# Patient Record
Sex: Female | Born: 1955 | Race: White | Hispanic: No | Marital: Married | Smoking: Never smoker
Health system: Southern US, Community
[De-identification: ages and names within clinical notes are randomized; demographics above are authoritative.]

## PROBLEM LIST (undated history)

## (undated) DIAGNOSIS — C4490 Unspecified malignant neoplasm of skin, unspecified: Secondary | ICD-10-CM

## (undated) DIAGNOSIS — R112 Nausea with vomiting, unspecified: Secondary | ICD-10-CM

## (undated) DIAGNOSIS — C50919 Malignant neoplasm of unspecified site of unspecified female breast: Secondary | ICD-10-CM

## (undated) DIAGNOSIS — H40039 Anatomical narrow angle, unspecified eye: Secondary | ICD-10-CM

## (undated) DIAGNOSIS — Z923 Personal history of irradiation: Secondary | ICD-10-CM

## (undated) DIAGNOSIS — Z9889 Other specified postprocedural states: Secondary | ICD-10-CM

## (undated) DIAGNOSIS — E7800 Pure hypercholesterolemia, unspecified: Secondary | ICD-10-CM

## (undated) DIAGNOSIS — R760 Raised antibody titer: Secondary | ICD-10-CM

## (undated) DIAGNOSIS — R51 Headache: Secondary | ICD-10-CM

## (undated) HISTORY — DX: Unspecified malignant neoplasm of skin, unspecified: C44.90

## (undated) HISTORY — PX: EYE SURGERY: SHX253

## (undated) HISTORY — DX: Anatomical narrow angle, unspecified eye: H40.039

## (undated) HISTORY — PX: DILATION AND CURETTAGE OF UTERUS: SHX78

## (undated) HISTORY — DX: Malignant neoplasm of unspecified site of unspecified female breast: C50.919

## (undated) HISTORY — DX: Raised antibody titer: R76.0

## (undated) HISTORY — DX: Headache: R51

## (undated) HISTORY — DX: Pure hypercholesterolemia, unspecified: E78.00

## (undated) HISTORY — PX: GANGLION CYST EXCISION: SHX1691

---

## 1997-01-20 HISTORY — PX: ABDOMINAL HYSTERECTOMY: SHX81

## 1997-11-16 ENCOUNTER — Encounter: Payer: Self-pay | Admitting: Obstetrics and Gynecology

## 1997-11-21 ENCOUNTER — Inpatient Hospital Stay (HOSPITAL_COMMUNITY): Admission: RE | Admit: 1997-11-21 | Discharge: 1997-11-24 | Payer: Self-pay | Admitting: Obstetrics and Gynecology

## 1999-03-21 ENCOUNTER — Encounter: Admission: RE | Admit: 1999-03-21 | Discharge: 1999-03-21 | Payer: Self-pay | Admitting: Obstetrics and Gynecology

## 1999-03-21 ENCOUNTER — Encounter: Payer: Self-pay | Admitting: Obstetrics and Gynecology

## 2000-05-04 ENCOUNTER — Encounter: Payer: Self-pay | Admitting: Obstetrics and Gynecology

## 2000-05-04 ENCOUNTER — Encounter: Admission: RE | Admit: 2000-05-04 | Discharge: 2000-05-04 | Payer: Self-pay | Admitting: Obstetrics and Gynecology

## 2001-04-02 ENCOUNTER — Other Ambulatory Visit: Admission: RE | Admit: 2001-04-02 | Discharge: 2001-04-02 | Payer: Self-pay | Admitting: Gynecology

## 2002-02-25 ENCOUNTER — Encounter: Payer: Self-pay | Admitting: Gynecology

## 2002-02-25 ENCOUNTER — Encounter: Admission: RE | Admit: 2002-02-25 | Discharge: 2002-02-25 | Payer: Self-pay | Admitting: Gynecology

## 2002-03-03 ENCOUNTER — Encounter: Payer: Self-pay | Admitting: Gynecology

## 2002-03-03 ENCOUNTER — Encounter: Admission: RE | Admit: 2002-03-03 | Discharge: 2002-03-03 | Payer: Self-pay | Admitting: Gynecology

## 2002-04-04 ENCOUNTER — Other Ambulatory Visit: Admission: RE | Admit: 2002-04-04 | Discharge: 2002-04-04 | Payer: Self-pay | Admitting: Gynecology

## 2002-04-19 ENCOUNTER — Encounter: Payer: Self-pay | Admitting: Family Medicine

## 2002-04-19 ENCOUNTER — Encounter: Admission: RE | Admit: 2002-04-19 | Discharge: 2002-04-19 | Payer: Self-pay | Admitting: Family Medicine

## 2003-04-24 ENCOUNTER — Other Ambulatory Visit: Admission: RE | Admit: 2003-04-24 | Discharge: 2003-04-24 | Payer: Self-pay | Admitting: Gynecology

## 2003-05-01 ENCOUNTER — Encounter: Admission: RE | Admit: 2003-05-01 | Discharge: 2003-05-01 | Payer: Self-pay | Admitting: Gynecology

## 2004-05-07 ENCOUNTER — Other Ambulatory Visit: Admission: RE | Admit: 2004-05-07 | Discharge: 2004-05-07 | Payer: Self-pay | Admitting: Gynecology

## 2004-06-06 ENCOUNTER — Encounter: Admission: RE | Admit: 2004-06-06 | Discharge: 2004-06-06 | Payer: Self-pay | Admitting: Gynecology

## 2005-05-12 ENCOUNTER — Other Ambulatory Visit: Admission: RE | Admit: 2005-05-12 | Discharge: 2005-05-12 | Payer: Self-pay | Admitting: Gynecology

## 2005-06-20 ENCOUNTER — Encounter: Admission: RE | Admit: 2005-06-20 | Discharge: 2005-06-20 | Payer: Self-pay | Admitting: Gynecology

## 2005-07-11 ENCOUNTER — Encounter: Admission: RE | Admit: 2005-07-11 | Discharge: 2005-07-11 | Payer: Self-pay | Admitting: Gynecology

## 2006-05-18 ENCOUNTER — Other Ambulatory Visit: Admission: RE | Admit: 2006-05-18 | Discharge: 2006-05-18 | Payer: Self-pay | Admitting: Gynecology

## 2006-09-28 ENCOUNTER — Encounter: Admission: RE | Admit: 2006-09-28 | Discharge: 2006-09-28 | Payer: Self-pay | Admitting: Gynecology

## 2007-01-21 HISTORY — PX: PERIPHERAL IRIDOTOMY: SHX5414

## 2007-09-29 ENCOUNTER — Encounter: Admission: RE | Admit: 2007-09-29 | Discharge: 2007-09-29 | Payer: Self-pay | Admitting: Gynecology

## 2008-10-12 ENCOUNTER — Encounter: Admission: RE | Admit: 2008-10-12 | Discharge: 2008-10-12 | Payer: Self-pay | Admitting: Gynecology

## 2008-12-27 ENCOUNTER — Encounter: Admission: RE | Admit: 2008-12-27 | Discharge: 2008-12-27 | Payer: Self-pay | Admitting: Internal Medicine

## 2009-10-17 ENCOUNTER — Encounter: Admission: RE | Admit: 2009-10-17 | Discharge: 2009-10-17 | Payer: Self-pay | Admitting: Gynecology

## 2010-10-09 ENCOUNTER — Other Ambulatory Visit: Payer: Self-pay | Admitting: Gynecology

## 2010-10-09 DIAGNOSIS — Z1231 Encounter for screening mammogram for malignant neoplasm of breast: Secondary | ICD-10-CM

## 2010-10-22 ENCOUNTER — Ambulatory Visit
Admission: RE | Admit: 2010-10-22 | Discharge: 2010-10-22 | Disposition: A | Discharge status: Home / Self Care | Payer: BC Managed Care – PPO | Source: Ambulatory Visit | Attending: Gynecology | Admitting: Gynecology

## 2010-10-22 DIAGNOSIS — Z1231 Encounter for screening mammogram for malignant neoplasm of breast: Secondary | ICD-10-CM

## 2010-10-28 ENCOUNTER — Other Ambulatory Visit: Payer: Self-pay | Admitting: Gynecology

## 2010-10-28 DIAGNOSIS — R928 Other abnormal and inconclusive findings on diagnostic imaging of breast: Secondary | ICD-10-CM

## 2010-11-08 ENCOUNTER — Ambulatory Visit
Admission: RE | Admit: 2010-11-08 | Discharge: 2010-11-08 | Disposition: A | Discharge status: Home / Self Care | Payer: BC Managed Care – PPO | Source: Ambulatory Visit | Attending: Gynecology | Admitting: Gynecology

## 2010-11-08 DIAGNOSIS — R928 Other abnormal and inconclusive findings on diagnostic imaging of breast: Secondary | ICD-10-CM

## 2011-01-21 HISTORY — PX: BREAST LUMPECTOMY: SHX2

## 2011-01-21 HISTORY — PX: BREAST SURGERY: SHX581

## 2011-10-13 ENCOUNTER — Other Ambulatory Visit: Payer: Self-pay | Admitting: Gynecology

## 2011-10-13 DIAGNOSIS — Z1231 Encounter for screening mammogram for malignant neoplasm of breast: Secondary | ICD-10-CM

## 2011-10-27 ENCOUNTER — Ambulatory Visit
Admission: RE | Admit: 2011-10-27 | Discharge: 2011-10-27 | Disposition: A | Discharge status: Home / Self Care | Payer: Managed Care, Other (non HMO) | Source: Ambulatory Visit | Attending: Gynecology | Admitting: Gynecology

## 2011-10-27 DIAGNOSIS — Z1231 Encounter for screening mammogram for malignant neoplasm of breast: Secondary | ICD-10-CM

## 2011-10-29 ENCOUNTER — Other Ambulatory Visit: Payer: Self-pay | Admitting: Gynecology

## 2011-10-29 DIAGNOSIS — R928 Other abnormal and inconclusive findings on diagnostic imaging of breast: Secondary | ICD-10-CM

## 2011-11-03 ENCOUNTER — Other Ambulatory Visit: Payer: Self-pay | Admitting: Gynecology

## 2011-11-03 ENCOUNTER — Ambulatory Visit
Admission: RE | Admit: 2011-11-03 | Discharge: 2011-11-03 | Disposition: A | Discharge status: Home / Self Care | Payer: Managed Care, Other (non HMO) | Source: Ambulatory Visit | Attending: Gynecology | Admitting: Gynecology

## 2011-11-03 DIAGNOSIS — R928 Other abnormal and inconclusive findings on diagnostic imaging of breast: Secondary | ICD-10-CM

## 2011-11-07 ENCOUNTER — Ambulatory Visit
Admission: RE | Admit: 2011-11-07 | Discharge: 2011-11-07 | Disposition: A | Discharge status: Home / Self Care | Payer: Managed Care, Other (non HMO) | Source: Ambulatory Visit | Attending: Gynecology | Admitting: Gynecology

## 2011-11-07 ENCOUNTER — Other Ambulatory Visit: Payer: Self-pay | Admitting: Gynecology

## 2011-11-07 DIAGNOSIS — R928 Other abnormal and inconclusive findings on diagnostic imaging of breast: Secondary | ICD-10-CM

## 2011-11-10 ENCOUNTER — Other Ambulatory Visit: Payer: Self-pay | Admitting: Gynecology

## 2011-11-10 DIAGNOSIS — C50911 Malignant neoplasm of unspecified site of right female breast: Secondary | ICD-10-CM

## 2011-11-11 ENCOUNTER — Telehealth: Payer: Self-pay | Admitting: *Deleted

## 2011-11-11 DIAGNOSIS — C50311 Malignant neoplasm of lower-inner quadrant of right female breast: Secondary | ICD-10-CM | POA: Insufficient documentation

## 2011-11-11 DIAGNOSIS — C50319 Malignant neoplasm of lower-inner quadrant of unspecified female breast: Secondary | ICD-10-CM

## 2011-11-11 NOTE — Telephone Encounter (Signed)
Confirmed [facility] for 11/19/11 at 0800 .  Instructions and contact information given.

## 2011-11-14 ENCOUNTER — Ambulatory Visit
Admission: RE | Admit: 2011-11-14 | Discharge: 2011-11-14 | Disposition: A | Discharge status: Home / Self Care | Payer: 59 | Source: Ambulatory Visit | Attending: Gynecology | Admitting: Gynecology

## 2011-11-14 DIAGNOSIS — C50911 Malignant neoplasm of unspecified site of right female breast: Secondary | ICD-10-CM

## 2011-11-14 MED ORDER — GADOBENATE DIMEGLUMINE 529 MG/ML IV SOLN
15.0000 mL | Freq: Once | INTRAVENOUS | Status: AC | PRN
  Administered 2011-11-14: 15 mL via INTRAVENOUS

## 2011-11-18 ENCOUNTER — Other Ambulatory Visit: Payer: Self-pay | Admitting: *Deleted

## 2011-11-18 DIAGNOSIS — C50319 Malignant neoplasm of lower-inner quadrant of unspecified female breast: Secondary | ICD-10-CM

## 2011-11-19 ENCOUNTER — Encounter: Payer: Self-pay | Admitting: Oncology

## 2011-11-19 ENCOUNTER — Ambulatory Visit: Payer: 59

## 2011-11-19 ENCOUNTER — Ambulatory Visit
Admission: RE | Admit: 2011-11-19 | Discharge: 2011-11-19 | Disposition: A | Discharge status: Home / Self Care | Payer: 59 | Source: Ambulatory Visit | Attending: Radiation Oncology | Admitting: Radiation Oncology

## 2011-11-19 ENCOUNTER — Other Ambulatory Visit (INDEPENDENT_AMBULATORY_CARE_PROVIDER_SITE_OTHER): Payer: Self-pay | Admitting: Surgery

## 2011-11-19 ENCOUNTER — Ambulatory Visit: Payer: 59 | Attending: Surgery | Admitting: Physical Therapy

## 2011-11-19 ENCOUNTER — Ambulatory Visit (HOSPITAL_BASED_OUTPATIENT_CLINIC_OR_DEPARTMENT_OTHER): Payer: 59 | Admitting: Surgery

## 2011-11-19 ENCOUNTER — Other Ambulatory Visit (HOSPITAL_BASED_OUTPATIENT_CLINIC_OR_DEPARTMENT_OTHER): Payer: 59 | Admitting: Lab

## 2011-11-19 ENCOUNTER — Encounter: Payer: Self-pay | Admitting: *Deleted

## 2011-11-19 ENCOUNTER — Ambulatory Visit (HOSPITAL_BASED_OUTPATIENT_CLINIC_OR_DEPARTMENT_OTHER): Payer: 59 | Admitting: Oncology

## 2011-11-19 VITALS — BP 134/80 | HR 82 | Temp 97.7°F | Resp 20 | Ht 67.0 in | Wt 167.3 lb

## 2011-11-19 DIAGNOSIS — C50319 Malignant neoplasm of lower-inner quadrant of unspecified female breast: Secondary | ICD-10-CM

## 2011-11-19 DIAGNOSIS — C50919 Malignant neoplasm of unspecified site of unspecified female breast: Secondary | ICD-10-CM

## 2011-11-19 DIAGNOSIS — D0590 Unspecified type of carcinoma in situ of unspecified breast: Secondary | ICD-10-CM

## 2011-11-19 DIAGNOSIS — M25619 Stiffness of unspecified shoulder, not elsewhere classified: Secondary | ICD-10-CM | POA: Insufficient documentation

## 2011-11-19 DIAGNOSIS — IMO0001 Reserved for inherently not codable concepts without codable children: Secondary | ICD-10-CM | POA: Insufficient documentation

## 2011-11-19 LAB — COMPREHENSIVE METABOLIC PANEL (CC13)
ALT: 20 U/L (ref 0–55)
AST: 24 U/L (ref 5–34)
Albumin: 4.2 g/dL (ref 3.5–5.0)
Alkaline Phosphatase: 52 U/L (ref 40–150)
BUN: 18 mg/dL (ref 7.0–26.0)
CO2: 27 mEq/L (ref 22–29)
Calcium: 9.9 mg/dL (ref 8.4–10.4)
Chloride: 108 mEq/L — ABNORMAL HIGH (ref 98–107)
Creatinine: 0.8 mg/dL (ref 0.6–1.1)
Glucose: 65 mg/dl — ABNORMAL LOW (ref 70–99)
Potassium: 4 mEq/L (ref 3.5–5.1)
Sodium: 143 mEq/L (ref 136–145)
Total Bilirubin: 1.29 mg/dL — ABNORMAL HIGH (ref 0.20–1.20)
Total Protein: 7.6 g/dL (ref 6.4–8.3)

## 2011-11-19 LAB — CBC WITH DIFFERENTIAL/PLATELET
BASO%: 0.8 % (ref 0.0–2.0)
Basophils Absolute: 0 10*3/uL (ref 0.0–0.1)
EOS%: 3.4 % (ref 0.0–7.0)
Eosinophils Absolute: 0.1 10*3/uL (ref 0.0–0.5)
HCT: 40 % (ref 34.8–46.6)
HGB: 13.7 g/dL (ref 11.6–15.9)
LYMPH%: 34.6 % (ref 14.0–49.7)
MCH: 30.7 pg (ref 25.1–34.0)
MCHC: 34.3 g/dL (ref 31.5–36.0)
MCV: 89.4 fL (ref 79.5–101.0)
MONO#: 0.3 10*3/uL (ref 0.1–0.9)
MONO%: 8.3 % (ref 0.0–14.0)
NEUT#: 1.9 10*3/uL (ref 1.5–6.5)
NEUT%: 52.9 % (ref 38.4–76.8)
Platelets: 188 10*3/uL (ref 145–400)
RBC: 4.48 10*6/uL (ref 3.70–5.45)
RDW: 14.6 % — ABNORMAL HIGH (ref 11.2–14.5)
WBC: 3.6 10*3/uL — ABNORMAL LOW (ref 3.9–10.3)
lymph#: 1.2 10*3/uL (ref 0.9–3.3)

## 2011-11-19 LAB — CANCER ANTIGEN 27.29: CA 27.29: 18 U/mL (ref 0–39)

## 2011-11-19 NOTE — Progress Notes (Signed)
Checked in new patient. No financial issues. She did have her [facility] packet.

## 2011-11-19 NOTE — Progress Notes (Signed)
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 female who underwent a screening mammogram which showed new calcifications. She underwent a stereotactic biopsy which showed DCIS, LCIS and possible microinvasion of lobular cancer. MRI showed only post biopsy change. No lesions were noted in the left breast. No enlarged lymph nodes. She had a palpable seroma cavity after her biopsy. She was on an estrogen patch but has taken that off since her diagnosis. She has no previous history of breast cancer.   PREVIOUS RADIATION THERAPY: No  PAST MEDICAL HISTORY:  has a past medical history of Breast cancer; Skin cancer; and Headache.    PAST SURGICAL HISTORY: Past Surgical History  Procedure Date  . Abdominal hysterectomy   . Peripheral iridotomy 2009    bilateral    FAMILY HISTORY: family history includes Breast cancer in her mother; Lung cancer in her father, maternal uncle, and paternal uncle; and Skin cancer in her mother.  SOCIAL HISTORY:  reports that she has never smoked. She does not have any smokeless tobacco history on file. She reports that she drinks alcohol. She reports that she does not use illicit drugs.  ALLERGIES: Review of patient's allergies indicates no known allergies.  MEDICATIONS:  Current Outpatient Prescriptions  Medication Sig Dispense Refill  . aspirin 81 MG tablet Take 81 mg by mouth daily.      . cyanocobalamin 500 MCG tablet Take 500 mcg by mouth daily.      . fish oil-omega-3 fatty acids 1000 MG capsule Take 2 g by mouth daily.      Marland Kitchen loratadine (CLARITIN REDITABS) 10 MG dissolvable tablet Take 10 mg by mouth daily.      . simvastatin (ZOCOR) 20 MG tablet Take 20 mg by  mouth daily.        REVIEW OF SYSTEMS:  A 15 point review of systems is documented in the electronic medical record. This was obtained by the nursing staff. However, I reviewed this with the patient to discuss relevant findings and make appropriate changes.  Pertinent items are noted in HPI.   PHYSICAL EXAM:  vitals were not taken for this visit. She is a pleasant female who appears her stated age.  She has no palpable cervical, axillary or supraclavicular adenopathy.  She has bruising in the inframammary fold and a palpable hematoma toward and 1 fingerbreadth above the nipple.  No abnormalities of the left breast.   LABORATORY DATA:  Lab Results  Component Value Date   WBC 3.6* 11/19/2011   HGB 13.7 11/19/2011   HCT 40.0 11/19/2011   MCV 89.4 11/19/2011   PLT 188 11/19/2011   No results found for this basename: NA, K, CL, CO2   No results found for this basename: ALT, AST, GGT, ALKPHOS, BILITOT     RADIOGRAPHY: Mr Breast Bilateral W Wo Contrast  11/14/2011  *RADIOLOGY REPORT*  Clinical Data: Recently diagnosed DCIS, LCIS and a small focus of invasive lobular carcinoma in the right breast.  BILATERAL BREAST MRI WITH AND WITHOUT CONTRAST  Technique: Multiplanar, multisequence MR images of both breasts were obtained prior to and following the intravenous administration of 15ml of multihance.  Three dimensional images were evaluated at the independent DynaCad workstation.  Comparison:  Mammograms dated 11/07/2011, 11/03/2011 and 10/27/2011  Findings: There is a moderate background parenchymal enhancement pattern. Post-biopsy changes are seen in the posterior central aspect of the right breast.  Expected post biopsy enhancement is seen in this area. There is a clip artifact associated with the biopsy site.  No other areas of abnormal enhancement are seen in the right breast.  There is no abnormal enhancement in the left breast.  There is no enlarged axillary or internal mammary adenopathy.   IMPRESSION: Expected post-biopsy changes in the right breast.  RECOMMENDATION: Treatment planning for the known right breast malignancy is recommended.  THREE-DIMENSIONAL MR IMAGE RENDERING ON INDEPENDENT WORKSTATION:  Three-dimensional MR images were rendered by post-processing of the original MR data on an independent workstation.  The three- dimensional MR images were interpreted, and findings were reported in the accompanying complete MRI report for this study.  BI-RADS CATEGORY 6:  Known biopsy-proven malignancy - appropriate action should be taken.   Original Report Authenticated By: Littie Deeds. Judyann Munson, M.D.    Mm Breast Stereo Biopsy Right  11/10/2011  **ADDENDUM** CREATED: 11/10/2011 12:33:53  The patient was by myself Dr. Micheline Maze.  The patient states she is doing well since her right breast biopsy with mild bruising at the biopsy site.  The patient was given the results of her biopsy which was compatible with DCIS and LCIS with a focus of invasive lobular carcinoma.  The pathology results are concordant with imaging findings.  Patient's immediate questions and concerns were addressed.  The patient was instructed that she could come by the breast center to pickup educational materials to peruse at her leisure.  The patient was given an MRI appointment 11/14/2011 at 08:00 a.m.  The patient was also given an appointment to the [facility] 11/19/2011 as she will be contacted by a representative from the [facility] with regards to a time for her appointment.  Addended by:  Melton Alar. Micheline Maze, M.D. on 11/10/2011 12:33:53.  **END ADDENDUM** SIGNED BY: Melton Alar. Micheline Maze, M.D.   11/07/2011  *RADIOLOGY REPORT*  Clinical Data:  Patient presents for stereotactic right breast biopsy of a group of microcalcifications of the slightly inner lower right breast  STEREOTACTIC-GUIDED VACUUM ASSISTED BIOPSY OF THE RIGHT BREAST AND SPECIMEN RADIOGRAPH  Comparison: 10/27/2011 and 11/03/2011  I met with the patient and we  discussed the procedure of stereotactic-guided biopsy, including benefits and alternatives. We discussed the high likelihood of a successful procedure. We discussed the risks of the procedure, including infection, bleeding, tissue injury, clip migration, and inadequate sampling. Informed, written consent was given.  Using sterile technique, 2% lidocaine, stereotactic guidance, and a 9 gauge vacuum assisted device, biopsy was performed of the targeted microcalcifications over the inner mid to lower right breast using a inferior to superior approach.  Specimen radiograph was performed, showing the targeted microcalcifications.  Specimens with calcifications are identified for pathology.  At the conclusion of the procedure, a dumbbell shaped tissue marker clip was deployed into the biopsy cavity.  Follow-up 2-view mammogram confirmed clip placement accurately at the biopsy site. A small to moderate hematoma is noted at the biopsy site.  IMPRESSION: Stereotactic-guided biopsy of right breast microcalcifications.  No apparent complications.   Original Report Authenticated By: Elba Barman, M.D.    Mm Breast Surgical Specimen  11/10/2011  **ADDENDUM** CREATED: 11/10/2011 12:33:53  The patient was by myself Dr. Micheline Maze.  The patient states she is doing well since her right breast biopsy with mild bruising at the biopsy site.  The patient was given the results of her biopsy which was compatible with DCIS and LCIS with a focus of invasive lobular carcinoma.  The pathology results are concordant with imaging findings.  Patient's immediate questions and concerns were addressed.  The patient was instructed that she could come by the breast center to pickup educational materials to peruse at her leisure.  The patient was given an MRI appointment 11/14/2011 at 08:00 a.m.  The patient was also given an appointment to the [facility]isciplinary Center 11/19/2011 as she will be contacted by a representative from the [facility] with  regards to a time for her appointment.  Addended by:  Melton Alar. Micheline Maze, M.D. on 11/10/2011 12:33:53.  **END ADDENDUM** SIGNED BY: Melton Alar. Micheline Maze, M.D.   11/07/2011  *RADIOLOGY REPORT*  Clinical Data:  Patient presents for stereotactic right breast biopsy of a group of microcalcifications of the slightly inner lower right breast  STEREOTACTIC-GUIDED VACUUM ASSISTED BIOPSY OF THE RIGHT BREAST AND SPECIMEN RADIOGRAPH  Comparison: 10/27/2011 and 11/03/2011  I met with the patient and we discussed the procedure of stereotactic-guided biopsy, including benefits and alternatives. We discussed the high likelihood of a successful procedure. We discussed the risks of the procedure, including infection, bleeding, tissue injury, clip migration, and inadequate sampling. Informed, written consent was given.  Using sterile technique, 2% lidocaine, stereotactic guidance, and a 9 gauge vacuum assisted device, biopsy was performed of the targeted microcalcifications over the inner mid to lower right breast using a inferior to superior approach.  Specimen radiograph was performed, showing the targeted microcalcifications.  Specimens with calcifications are identified for pathology.  At the conclusion of the procedure, a dumbbell shaped tissue marker clip was deployed into the biopsy cavity.  Follow-up 2-view mammogram confirmed clip placement accurately at the biopsy site. A small to moderate hematoma is noted at the biopsy site.  IMPRESSION: Stereotactic-guided biopsy of right breast microcalcifications.  No apparent complications.   Original Report Authenticated By: Elba Barman, M.D.    Mm Digital Diag Ltd R  11/03/2011  *RADIOLOGY REPORT*  Clinical Data:  The patient returns for evaluation of calcifications in the right lower inner quadrant posteriorly noted on recent screening study dated 10/27/2011.  DIGITAL DIAGNOSTIC RIGHT LIMITED MAMMOGRAM  Comparison:  10/22/2010, 10/17/2009, 10/12/2008  Findings:  Magnification  views demonstrate new clustered calcifications in the right lower inner quadrant posteriorly. These vary in size and shape.  The appearance is slightly suspicious and biopsy is suggested.  The patient and I discussed stereotactic core needle biopsy versus surgical excision.  I suggested stereotactic core needle biopsy and the patient agreed with this plan.  IMPRESSION: Slightly suspicious calcifications in the right lower inner quadrant posteriorly.  RECOMMENDATION: Stereotactic core needle biopsy is suggested.  This has been scheduled for 11/07/2011.  BI-RADS CATEGORY 4:  Suspicious abnormality - biopsy should be considered.   Original Report Authenticated By: Daryl Eastern, M.D.    Mm Digital Screening  10/28/2011  *RADIOLOGY REPORT*  Clinical Data: Screening.  DIGITAL BILATERAL SCREENING MAMMOGRAM WITH CAD  DIGITAL BREAST TOMOSYNTHESIS  Digital breast tomosynthesis images are acquired in two projections.  These images are reviewed in combination with the digital mammogram, confirming the findings below.  Comparison:  Previous exams.  Findings: Two views of each breast demonstrate heterogeneously dense tissue.  In the right breast, calcifications warrant further evaluation with magnified views.  In the left breast, no mass or malignant type calcifications are identified.  Images were processed with CAD.  IMPRESSION: Further  evaluation is suggested for calcifications in the right breast.  RECOMMENDATION: Diagnostic mammogram of the right breast. (Code:FI-R-72M)  BI-RADS CATEGORY 0:  Incomplete.  Need additional imaging evaluation and/or prior mammograms for comparison.   Original Report Authenticated By: Darrol Angel, M.D.       IMPRESSION: DCIS of the right breast  PLAN:  We discussed breast conservation and the use of radiation to decrease local failure. We discussed the possibility of invasive disease.  We discussed the process of simulation and placement of tattoos. We discussed skin changes,  lung damage, and fatigue.  We discussed 6 weeks of treatment as an outpatient.  We discussed radiation would start 4 weeks after surgery.  She met with medical oncology, surgery, and a member of our patient and family support staff.  She also met with physical therapy.   I spent 60 minutes  face to face with the patient and more than 50% of that time was spent in counseling and/or coordination of care.   ------------------------------------------------  Lurline Hare, MD

## 2011-11-19 NOTE — Progress Notes (Signed)
Re:   Kristin Mills DOB:   1955-07-27 MRN:   [id number]  [facility]  ASSESSMENT AND PLAN: 1.  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 She come to the [facility].  I discussed the surgical options of lumpectomy vs. mastectomy.  If mastectomy, there is the possibility of reconstruction.   I discussed the options of lymph node biopsy.  The treatment plan depends on the pathologic staging of the tumor and the patient's personal wishes.  The risks of surgery include, but are not limited to, bleeding, infection, the need for further surgery, and nerve injury.  The patient has been given literature on the treatment of breast cancer.  Treating oncologists:  Donnie Coffin and Michell Heinrich  Will hold off on surgery for 2 to 4 weeks to allow right breast hematoma to resolve.  Plan:  1. Right breast needle loc lumpectomy and right axillary SLNBx,  2.  Rad tx,  3. Antiestrogen tx  2.  Lupus anticoagulant positive 3.  Hypercholesterolemia 4.  On aspirin - to stop one week ahead 5.  History of narrow angle glaucoma - treated  No chief complaint on file.  REFERRING PHYSICIAN:  Dr. [name]  HISTORY OF PRESENT ILLNESS: Kristin Mills is a 56 y.o. (DOB: May 02, 1955)  white female whose primary care physician is Dr. [name]  and comes to [facility] for right breast cancer.  She has no family history of breast cancer.  She did wear a Vivelle hormone patch for the past 5 years.  She sees Dr. Nicholas Lose.  She had a hysterectomy 1999 by Dr. Michaelle Copas for fibroids.  She had a right breast biopsy for microcalcifications.  She felt nothing abnormal in her breast.  She had a mammogram on 10/27/2011 that showed microcalcifications in the right breast.  This lead to a biopsy of the right breast.  The path is interesting in that it shows LCIS, DCIS, and possible invasive lobular carcinoma. Her MRI 11/14/2011 showed only  post biopsy changes.   No past medical history on file.   No past surgical history on file.    Current Outpatient Prescriptions  Medication Sig Dispense Refill  . aspirin 81 MG tablet Take 81 mg by mouth daily.      . cyanocobalamin 500 MCG tablet Take 500 mcg by mouth daily.      . fish oil-omega-3 fatty acids 1000 MG capsule Take 2 g by mouth daily.      Marland Kitchen loratadine (CLARITIN REDITABS) 10 MG dissolvable tablet Take 10 mg by mouth daily.      . simvastatin (ZOCOR) 20 MG tablet Take 20 mg by mouth daily.         No Known Allergies  REVIEW OF SYSTEMS: Skin:  No history of rash.  No history of abnormal moles. Infection:  No history of hepatitis or HIV.  No history of MRSA. Neurologic:  No history of stroke.  No history of seizure.  No history of headaches. Cardiac:  No history of hypertension. No history of heart disease.  No history of prior cardiac catheterization.  No history of seeing a cardiologist. Pulmonary:  Does not smoke cigarettes.  No asthma or bronchitis.  No OSA/CPAP.  Endocrine:  No diabetes. No thyroid disease. Gastrointestinal:  No history of stomach disease.  No history of liver disease.  No history of gall bladder disease.  No history of pancreas disease.  No history of colon disease. Urologic:  No history of kidney stones.  No history of bladder infections. GYN:  Hysterectomy 1999 by Dr. Michaelle Copas for fibroids.  Stopped Vivelle patch recently. Musculoskeletal:  No history of joint or back disease. Hematologic:  Bruises easily and is tested positive for lupus anti-coagulant (leads to an increase propensity to clot). Psycho-social:  The patient is oriented.   The patient has no obvious psychologic or social impairment to understanding our conversation and plan.  SOCIAL and FAMILY HISTORY: Married. Husband with her. 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  PHYSICAL EXAM: There were no vitals taken for this visit.  General: WN  WF who is alert and generally healthy appearing.  HEENT: Normal. Pupils equal. Neck: Supple. No mass.  No thyroid mass. Lymph Nodes:  No supraclavicular, cervical nodes, or axillary nodes. Lungs: Clear to auscultation and symmetric breath sounds. Heart:  RRR. No murmur or rub. Breast:  Right - Bruise and 3 x 4 cm mass in lower breast.  Left - unremarkable.  Abdomen: Soft. No mass. No tenderness. No hernia. Normal bowel sounds.  No abdominal scars. Rectal: Not done. Extremities:  Good strength and ROM  in upper and lower extremities. Neurologic:  Grossly intact to motor and sensory function. Psychiatric: Has normal mood and affect. Behavior is normal.   DATA REVIEWED: X-rays and path.  Path to patient.  Ovidio Kin, MD,  [facility] [address]  [address]   [place], [place]    [address] Phone:  [phone] FAX:  [phone]

## 2011-11-19 NOTE — Progress Notes (Signed)
Kristin Mills [id number] 1955-11-14 56 y.o. 11/19/2011 11:26 AM  CC Dr Jake Shark kim   REASON FOR CONSULTATION:  Breast Cancer  Patient was seen in the Multidisciplinary Breast Clinic for discussion of her treatment options. She was seen by Dr. Pierce Crane, Radiation Oncologist and Surgeon from [facility]  STAGE:   Cancer of lower-inner quadrant of female breast   Primary site: Breast (Right)   Staging method: AJCC 7th Edition   Clinical: Stage 0 (Tis (DCIS), N0, cM0)   Summary: Stage 0 (Tis (DCIS), N0, cM0)  REFERRING PHYSICIAN: Asa bove  HISTORY OF PRESENT ILLNESS:  Kristin Mills is a 56 y.o. female.  from [facility]. She is been in good health. He undergoes annual screening mammography. Screening mammogram 10/27/2011 within the symphysis showed calcifications in the right breast. Diagnostic right mammogram performed 11/03/2011 suggested calcifications look right lower inner quadrant. 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 He the DCIS was ER positive at 100% percent PR +86%. Bilateral MRI scan of the breasts performed on 11/14/2011 showed rectal enhancement post biopsy change and no other abnormalities seen.  Past Medical History:  Past Medical History  Diagnosis Date  . Breast cancer   . Skin cancer     basal cell carcinoma  . Headache     Past Surgical History:  Past Surgical History  Procedure Date  . Abdominal hysterectomy                                                                        ovaries are left in 11/99   . Peripheral iridotomy 2009    bilateral    Family History:  Family History  Problem Relation Age of Onset       . Skin cancer Mother   . Lung cancer Father   . Lung cancer Maternal Uncle   . Lung cancer Paternal Uncle     Social History  History  Substance Use Topics  . Smoking status: Never Smoker   . Smokeless tobacco: Not on file  . Alcohol Use: Yes     Allergies:  No Known Allergies  Current Medications:  Current Outpatient Prescriptions  Medication Sig Dispense Refill  . aspirin 81 MG tablet Take 81 mg by mouth daily.      . cyanocobalamin 500 MCG tablet Take 500 mcg by mouth daily.      . fish oil-omega-3 fatty acids 1000 MG capsule Take 2 g by mouth daily.      Marland Kitchen loratadine (CLARITIN REDITABS) 10 MG dissolvable tablet Take 10 mg by mouth daily.      . simvastatin (ZOCOR) 20 MG tablet Take 20 mg by mouth daily.        OB/GYN History:  G2 P2 Menarche menopause time of hysterectomy history of hormone replacement therapy for 5 years discontinued a few days ago.  Fertility Discussion: NA Prior History of Cancer: N.  Health Maintenance:  Colonoscopy y Bone Density y Last PAP smear y  ECOG PERFORMANCE STATUS: 0 - Asymptomatic  Genetic Counseling/testing: no  REVIEW OF SYSTEMS:  A comprehensive review of systems was negative.  PHYSICAL EXAMINATION: Blood pressure 134/80, pulse 82, temperature 97.7 F (36.5  C), resp. rate 20, height 5\' 7"  (1.702 m), weight 167 lb 4.8 oz (75.887 kg).  HEENT:  Sclerae anicteric, conjunctivae pink.  Oropharynx clear.  No mucositis or candidiasis.  Nodes:  No cervical, supraclavicular, or axillary lymphadenopathy palpated.  Breast Exam:  Right breast is benign.  No masses, discharge, skin change, or nipple inversion.  Left breast is benign.  No masses, discharge, skin change, or nipple inversion..  Lungs:  Clear to auscultation bilaterally.  No crackles, rhonchi, or wheezes.  Heart:  Regular rate and rhythm.  Abdomen:  Soft, nontender.  Positive bowel sounds.  No organomegaly or masses palpated.  Musculoskeletal:  No focal spinal tenderness to palpation.  Extremities:  Benign.  No peripheral edema or cyanosis.  Skin:  Benign.  Neuro:  Nonfocal.     STUDIES/RESULTS: Mr Breast Bilateral W Wo Contrast  11/14/2011  *RADIOLOGY REPORT*  Clinical Data: Recently diagnosed DCIS, LCIS and a small  focus of invasive lobular carcinoma in the right breast.  BILATERAL BREAST MRI WITH AND WITHOUT CONTRAST  Technique: Multiplanar, multisequence MR images of both breasts were obtained prior to and following the intravenous administration of 15ml of multihance.  Three dimensional images were evaluated at the independent DynaCad workstation.  Comparison:  Mammograms dated 11/07/2011, 11/03/2011 and 10/27/2011  Findings: There is a moderate background parenchymal enhancement pattern. Post-biopsy changes are seen in the posterior central aspect of the right breast.  Expected post biopsy enhancement is seen in this area. There is a clip artifact associated with the biopsy site.  No other areas of abnormal enhancement are seen in the right breast.  There is no abnormal enhancement in the left breast.  There is no enlarged axillary or internal mammary adenopathy.  IMPRESSION: Expected post-biopsy changes in the right breast.  RECOMMENDATION: Treatment planning for the known right breast malignancy is recommended.  THREE-DIMENSIONAL MR IMAGE RENDERING ON INDEPENDENT WORKSTATION:  Three-dimensional MR images were rendered by post-processing of the original MR data on an independent workstation.  The three- dimensional MR images were interpreted, and findings were reported in the accompanying complete MRI report for this study.  BI-RADS CATEGORY 6:  Known biopsy-proven malignancy - appropriate action should be taken.   Original Report Authenticated By: Littie Deeds. Judyann Munson, M.D.    Mm Breast Stereo Biopsy Right  11/10/2011  **ADDENDUM** CREATED: 11/10/2011 12:33:53  The patient was by myself Dr. Micheline Maze.  The patient states she is doing well since her right breast biopsy with mild bruising at the biopsy site.  The patient was given the results of her biopsy which was compatible with DCIS and LCIS with a focus of invasive lobular carcinoma.  The pathology results are concordant with imaging findings.  Patient's immediate questions  and concerns were addressed.  The patient was instructed that she could come by the breast center to pickup educational materials to peruse at her leisure.  The patient was given an MRI appointment 11/14/2011 at 08:00 a.m.  The patient was also given an appointment to the Multidisciplinary Center 11/19/2011 as she will be contacted by a representative from the [facility] with regards to a time for her appointment.  Addended by:  Melton Alar. Micheline Maze, M.D. on 11/10/2011 12:33:53.  **END ADDENDUM** SIGNED BY: Melton Alar. Micheline Maze, M.D.   11/07/2011  *RADIOLOGY REPORT*  Clinical Data:  Patient presents for stereotactic right breast biopsy of a group of microcalcifications of the slightly inner lower right breast  STEREOTACTIC-GUIDED VACUUM ASSISTED BIOPSY OF THE RIGHT BREAST AND SPECIMEN RADIOGRAPH  Comparison: 10/27/2011 and 11/03/2011  I met with the patient and we discussed the procedure of stereotactic-guided biopsy, including benefits and alternatives. We discussed the high likelihood of a successful procedure. We discussed the risks of the procedure, including infection, bleeding, tissue injury, clip migration, and inadequate sampling. Informed, written consent was given.  Using sterile technique, 2% lidocaine, stereotactic guidance, and a 9 gauge vacuum assisted device, biopsy was performed of the targeted microcalcifications over the inner mid to lower right breast using a inferior to superior approach.  Specimen radiograph was performed, showing the targeted microcalcifications.  Specimens with calcifications are identified for pathology.  At the conclusion of the procedure, a dumbbell shaped tissue marker clip was deployed into the biopsy cavity.  Follow-up 2-view mammogram confirmed clip placement accurately at the biopsy site. A small to moderate hematoma is noted at the biopsy site.  IMPRESSION: Stereotactic-guided biopsy of right breast microcalcifications.  No apparent complications.   Original Report Authenticated  By: Elba Barman, M.D.    Mm Breast Surgical Specimen  11/10/2011  **ADDENDUM** CREATED: 11/10/2011 12:33:53  The patient was by myself Dr. Micheline Maze.  The patient states she is doing well since her right breast biopsy with mild bruising at the biopsy site.  The patient was given the results of her biopsy which was compatible with DCIS and LCIS with a focus of invasive lobular carcinoma.  The pathology results are concordant with imaging findings.  Patient's immediate questions and concerns were addressed.  The patient was instructed that she could come by the breast center to pickup educational materials to peruse at her leisure.  The patient was given an MRI appointment 11/14/2011 at 08:00 a.m.  The patient was also given an appointment to the Multidisciplinary Center 11/19/2011 as she will be contacted by a representative from the [facility] with regards to a time for her appointment.  Addended by:  Melton Alar. Micheline Maze, M.D. on 11/10/2011 12:33:53.  **END ADDENDUM** SIGNED BY: Melton Alar. Micheline Maze, M.D.   11/07/2011  *RADIOLOGY REPORT*  Clinical Data:  Patient presents for stereotactic right breast biopsy of a group of microcalcifications of the slightly inner lower right breast  STEREOTACTIC-GUIDED VACUUM ASSISTED BIOPSY OF THE RIGHT BREAST AND SPECIMEN RADIOGRAPH  Comparison: 10/27/2011 and 11/03/2011  I met with the patient and we discussed the procedure of stereotactic-guided biopsy, including benefits and alternatives. We discussed the high likelihood of a successful procedure. We discussed the risks of the procedure, including infection, bleeding, tissue injury, clip migration, and inadequate sampling. Informed, written consent was given.  Using sterile technique, 2% lidocaine, stereotactic guidance, and a 9 gauge vacuum assisted device, biopsy was performed of the targeted microcalcifications over the inner mid to lower right breast using a inferior to superior approach.  Specimen radiograph was performed, showing  the targeted microcalcifications.  Specimens with calcifications are identified for pathology.  At the conclusion of the procedure, a dumbbell shaped tissue marker clip was deployed into the biopsy cavity.  Follow-up 2-view mammogram confirmed clip placement accurately at the biopsy site. A small to moderate hematoma is noted at the biopsy site.  IMPRESSION: Stereotactic-guided biopsy of right breast microcalcifications.  No apparent complications.   Original Report Authenticated By: Elba Barman, M.D.    Mm Digital Diag Ltd R  11/03/2011  *RADIOLOGY REPORT*  Clinical Data:  The patient returns for evaluation of calcifications in the right lower inner quadrant posteriorly noted on recent screening study dated 10/27/2011.  DIGITAL DIAGNOSTIC RIGHT LIMITED MAMMOGRAM  Comparison:  10/22/2010, 10/17/2009, 10/12/2008  Findings:  Magnification views demonstrate new clustered calcifications in the right lower inner quadrant posteriorly. These vary in size and shape.  The appearance is slightly suspicious and biopsy is suggested.  The patient and I discussed stereotactic core needle biopsy versus surgical excision.  I suggested stereotactic core needle biopsy and the patient agreed with this plan.  IMPRESSION: Slightly suspicious calcifications in the right lower inner quadrant posteriorly.  RECOMMENDATION: Stereotactic core needle biopsy is suggested.  This has been scheduled for 11/07/2011.  BI-RADS CATEGORY 4:  Suspicious abnormality - biopsy should be considered.   Original Report Authenticated By: Daryl Eastern, M.D.    Mm Digital Screening  10/28/2011  *RADIOLOGY REPORT*  Clinical Data: Screening.  DIGITAL BILATERAL SCREENING MAMMOGRAM WITH CAD  DIGITAL BREAST TOMOSYNTHESIS  Digital breast tomosynthesis images are acquired in two projections.  These images are reviewed in combination with the digital mammogram, confirming the findings below.  Comparison:  Previous exams.  Findings: Two views of each  breast demonstrate heterogeneously dense tissue.  In the right breast, calcifications warrant further evaluation with magnified views.  In the left breast, no mass or malignant type calcifications are identified.  Images were processed with CAD.  IMPRESSION: Further evaluation is suggested for calcifications in the right breast.  RECOMMENDATION: Diagnostic mammogram of the right breast. (Code:FI-R-9M)  BI-RADS CATEGORY 0:  Incomplete.  Need additional imaging evaluation and/or prior mammograms for comparison.   Original Report Authenticated By: Darrol Angel, M.D.      LABS:    Chemistry   No results found for this basename: NA, K, CL, CO2, BUN, CREATININE, GLU   No results found for this basename: CALCIUM, ALKPHOS, AST, ALT, BILITOT      Lab Results  Component Value Date   WBC 3.6* 11/19/2011   HGB 13.7 11/19/2011   HCT 40.0 11/19/2011   MCV 89.4 11/19/2011   PLT 188 11/19/2011       PATHOLOGY: DCIS with possible lobular neoplasia  ASSESSMENT    We discussed making final recommendations after her lumpectomy and sentinel lymph node evaluation. In all likelihood she will receive hormone therapy alone.  Clinical Trial Eligibility: B. 43 Multidisciplinary conference discussion yes    PLAN:    As above we discussed anti-hormonal therapy. We also discussed possible enrollment in the B. 43 protocol which looks at 2 cycles of Herceptin given prior to return radiation. This on the assumption that her DCIS is HER-2 positive. We'll make final recommendations after surgery. Her prognosis is excellent.       Discussion: Patient is being treated per NCCN breast cancer care guidelines appropriate for stage.0   Thank you so much for allowing me to participate in the care of Craig Wahler. I will continue to follow up the patient with you and assist in her care.  All questions were answered. The patient knows to call the clinic with any problems, questions or concerns. We can  certainly see the patient much sooner if necessary.  I spent 20 minutes counseling the patient face to face. The total time spent in the appointment was 40 minutes.       Pierce Crane M.D. FRCP C. 11/19/2011, 11:26 AM    This is an addendum on note on 11/19/2011   Patient relates having a history of easy bruising. She was worked up and was found to have a positive lupus anticoagulant test. She's been placed on aspirin which he has been taking off and on for  the past few months. She does note that the bruising is continued. She does not have a specific history of bleeding or family history thereof. I told her I was unsure of the workup that with took place but all likelihood to that she does not have an antiphospholipid syndrome requiring aspirin. She might require a workup for bruising and bleeding if this continues.   Pierce Crane

## 2011-11-20 NOTE — Addendum Note (Signed)
Encounter addended by: Lynore Coscia Mintz Taunja Brickner, RN on: 11/20/2011  6:01 PM<BR>     Documentation filed: Charges VN

## 2011-11-21 ENCOUNTER — Telehealth (INDEPENDENT_AMBULATORY_CARE_PROVIDER_SITE_OTHER): Payer: Self-pay

## 2011-11-21 ENCOUNTER — Encounter: Payer: Self-pay | Admitting: *Deleted

## 2011-11-21 NOTE — Progress Notes (Signed)
[facility] Psychosocial Distress Screening Clinical Social Work  Patient completed the distress screening protocol, and scored a 3 on the Psychosocial Distress Thermometer which indicates mild distress. Clinical Social Worker met with patient in [facility] to assess for distress and other psychosocial needs.  Pt stated she was feeling much better after speaking with the physicians and having more information.  CSW shared information on the Support Team and support services at [facility], and pt was agreeable to a reach to recovery referral.  CSW encouraged pt to call with any additional questions or concerns.           Tamala Julian, MSW, LCSW Clinical Social Worker [facility] [phone]

## 2011-11-21 NOTE — Addendum Note (Signed)
Encounter addended by: Shakela Donati Mintz Elinora Weigand, RN on: 11/21/2011  9:42 AM<BR>     Documentation filed: Charges VN

## 2011-11-21 NOTE — Telephone Encounter (Signed)
Pt aware of P/O  appt  For 12/24/11 @ 1245

## 2011-11-24 ENCOUNTER — Encounter: Payer: Self-pay | Admitting: *Deleted

## 2011-11-24 ENCOUNTER — Telehealth: Payer: Self-pay | Admitting: *Deleted

## 2011-11-24 NOTE — Telephone Encounter (Signed)
Please schedule lab/f/u on 12/3 lab 10:00 Dr. Donnie Coffin 10:30.   Patient confirmed over the phone the new date and time on 12-23-2011 starting at 10:00am

## 2011-11-24 NOTE — Progress Notes (Signed)
Mailed after appt letter to pt. 

## 2011-11-25 ENCOUNTER — Telehealth: Payer: Self-pay | Admitting: *Deleted

## 2011-11-25 NOTE — Telephone Encounter (Signed)
Spoke to pt concerning [facility] from 11/19/11.  Pt denies questions or concerns regarding dx or treatment care plan.  Confirmed surgery date and future appts.  Encourage pt to call with needs.  Received verbal understanding.  Contact information given.

## 2011-11-28 ENCOUNTER — Encounter (HOSPITAL_COMMUNITY): Payer: Self-pay | Admitting: Pharmacy Technician

## 2011-12-01 ENCOUNTER — Telehealth: Payer: Self-pay | Admitting: *Deleted

## 2011-12-01 NOTE — Telephone Encounter (Signed)
Return pt call regarding medication correction in computer.  Changes made.  Pt denies questions or concerns at this time .  Confirmed future appts.  Discussed importance of clear margins for radiation and goal to not re-excise.  Received verbal understanding.  Encouraged pt to call with needs.  Contact information given.

## 2011-12-02 NOTE — Pre-Procedure Instructions (Signed)
20 Nicol Usrey  12/02/2011   Your procedure is scheduled on: Tuesday, November 19th   Report to [facility] at 12:45 PM.  Call this number if you have problems the morning of surgery: [phone]   Remember:   Do not eat food or drink any liquids:After Midnight Monday.    Take these medicines the morning of surgery with A SIP OF WATER: Claritin   Do not wear jewelry, make-up or nail polish.  Do not wear lotions, powders, or perfumes. You may NOT wear deodorant.   LADIES---Do not shave 48 hours prior to surgery.   Do not bring valuables to the hospital.  Contacts, dentures or bridgework may not be worn into surgery.   Leave suitcase in the car. After surgery it may be brought to your room.  For patients admitted to the hospital, checkout time is 11:00 AM the day of discharge.   Patients discharged the day of surgery will not be allowed to drive home and will need             someone to stay with them for the first 24 hrs.   Name and phone number of your driver:    Special Instructions: Shower using CHG 2 nights before surgery and the night before surgery.  If you shower the day of surgery use CHG.  Use special wash - you have one bottle of CHG for all showers.  You should use approximately 1/3 of the bottle for each shower.   Please read over the following fact sheets that you were given: Pain Booklet, MRSA Information and Surgical Site Infection Prevention

## 2011-12-03 ENCOUNTER — Encounter (HOSPITAL_COMMUNITY)
Admission: RE | Admit: 2011-12-03 | Discharge: 2011-12-03 | Disposition: A | Discharge status: Home / Self Care | Payer: 59 | Source: Ambulatory Visit | Attending: Surgery | Admitting: Surgery

## 2011-12-03 ENCOUNTER — Encounter (HOSPITAL_COMMUNITY): Payer: Self-pay

## 2011-12-03 LAB — CBC
HCT: 39.6 % (ref 36.0–46.0)
Hemoglobin: 13.5 g/dL (ref 12.0–15.0)
MCH: 29.7 pg (ref 26.0–34.0)
MCHC: 34.1 g/dL (ref 30.0–36.0)
MCV: 87.2 fL (ref 78.0–100.0)
Platelets: 202 10*3/uL (ref 150–400)
RBC: 4.54 MIL/uL (ref 3.87–5.11)
RDW: 13.8 % (ref 11.5–15.5)
WBC: 5.1 10*3/uL (ref 4.0–10.5)

## 2011-12-03 LAB — SURGICAL PCR SCREEN
MRSA, PCR: NEGATIVE
Staphylococcus aureus: NEGATIVE

## 2011-12-08 MED ORDER — CEFAZOLIN SODIUM-DEXTROSE 2-3 GM-% IV SOLR: 2.0000 g | INTRAVENOUS | Status: DC

## 2011-12-09 ENCOUNTER — Encounter (HOSPITAL_COMMUNITY): Payer: Self-pay | Admitting: Anesthesiology

## 2011-12-09 ENCOUNTER — Ambulatory Visit
Admission: RE | Admit: 2011-12-09 | Discharge: 2011-12-09 | Disposition: A | Discharge status: Home / Self Care | Payer: 59 | Source: Ambulatory Visit | Attending: Surgery | Admitting: Surgery

## 2011-12-09 ENCOUNTER — Encounter (HOSPITAL_COMMUNITY)
Admission: RE | Admit: 2011-12-09 | Discharge: 2011-12-09 | Disposition: A | Discharge status: Home / Self Care | Payer: 59 | Source: Ambulatory Visit | Attending: Surgery | Admitting: Surgery

## 2011-12-09 ENCOUNTER — Observation Stay (HOSPITAL_BASED_OUTPATIENT_CLINIC_OR_DEPARTMENT_OTHER)
Admission: RE | Admit: 2011-12-09 | Discharge: 2011-12-10 | Disposition: A | Discharge status: Home / Self Care | Payer: 59 | Source: Ambulatory Visit | Attending: Surgery | Admitting: Surgery

## 2011-12-09 ENCOUNTER — Encounter (HOSPITAL_COMMUNITY)
Admission: RE | Disposition: A | Discharge status: Home / Self Care | Payer: Self-pay | Source: Ambulatory Visit | Attending: Surgery

## 2011-12-09 ENCOUNTER — Ambulatory Visit (HOSPITAL_COMMUNITY): Payer: 59 | Admitting: Anesthesiology

## 2011-12-09 DIAGNOSIS — C50319 Malignant neoplasm of lower-inner quadrant of unspecified female breast: Secondary | ICD-10-CM

## 2011-12-09 DIAGNOSIS — C50919 Malignant neoplasm of unspecified site of unspecified female breast: Secondary | ICD-10-CM

## 2011-12-09 DIAGNOSIS — Z01812 Encounter for preprocedural laboratory examination: Secondary | ICD-10-CM | POA: Insufficient documentation

## 2011-12-09 DIAGNOSIS — D0590 Unspecified type of carcinoma in situ of unspecified breast: Principal | ICD-10-CM | POA: Insufficient documentation

## 2011-12-09 DIAGNOSIS — N6089 Other benign mammary dysplasias of unspecified breast: Secondary | ICD-10-CM

## 2011-12-09 DIAGNOSIS — Z7982 Long term (current) use of aspirin: Secondary | ICD-10-CM | POA: Insufficient documentation

## 2011-12-09 DIAGNOSIS — E7800 Pure hypercholesterolemia, unspecified: Secondary | ICD-10-CM | POA: Insufficient documentation

## 2011-12-09 HISTORY — DX: Malignant neoplasm of unspecified site of unspecified female breast: C50.919

## 2011-12-09 HISTORY — PX: BREAST LUMPECTOMY WITH NEEDLE LOCALIZATION AND AXILLARY SENTINEL LYMPH NODE BX: SHX5760

## 2011-12-09 SURGERY — BREAST LUMPECTOMY WITH NEEDLE LOCALIZATION AND AXILLARY SENTINEL LYMPH NODE BX
Anesthesia: General | Site: Breast | Laterality: Right | Wound class: Clean

## 2011-12-09 MED ORDER — MIDAZOLAM HCL 2 MG/2ML IJ SOLN
INTRAMUSCULAR | Status: AC
  Filled 2011-12-09: qty 2

## 2011-12-09 MED ORDER — SODIUM CHLORIDE 0.9 % IJ SOLN
INTRAMUSCULAR | Status: DC | PRN
  Administered 2011-12-09: 15:00:00 via SUBCUTANEOUS

## 2011-12-09 MED ORDER — OXYCODONE HCL 5 MG/5ML PO SOLN: 5.0000 mg | Freq: Once | ORAL | Status: DC | PRN

## 2011-12-09 MED ORDER — OXYCODONE HCL 5 MG PO TABS: 5.0000 mg | ORAL_TABLET | Freq: Once | ORAL | Status: DC | PRN

## 2011-12-09 MED ORDER — METOCLOPRAMIDE HCL 5 MG/ML IJ SOLN: 10.0000 mg | Freq: Once | INTRAMUSCULAR | Status: DC | PRN

## 2011-12-09 MED ORDER — GLYCOPYRROLATE 0.2 MG/ML IJ SOLN
INTRAMUSCULAR | Status: DC | PRN
  Administered 2011-12-09: 0.4 mg via INTRAVENOUS

## 2011-12-09 MED ORDER — FENTANYL CITRATE 0.05 MG/ML IJ SOLN
INTRAMUSCULAR | Status: AC
  Filled 2011-12-09: qty 2

## 2011-12-09 MED ORDER — BUPIVACAINE HCL (PF) 0.25 % IJ SOLN
INTRAMUSCULAR | Status: AC
  Filled 2011-12-09: qty 30

## 2011-12-09 MED ORDER — CHLORHEXIDINE GLUCONATE 4 % EX LIQD: 1.0000 "application " | Freq: Once | CUTANEOUS | Status: DC

## 2011-12-09 MED ORDER — METHYLENE BLUE 1 % INJ SOLN
INTRAMUSCULAR | Status: AC
  Filled 2011-12-09: qty 10

## 2011-12-09 MED ORDER — DEXAMETHASONE SODIUM PHOSPHATE 4 MG/ML IJ SOLN
INTRAMUSCULAR | Status: DC | PRN
  Administered 2011-12-09: 8 mg via INTRAVENOUS

## 2011-12-09 MED ORDER — LACTATED RINGERS IV SOLN
INTRAVENOUS | Status: DC | PRN
  Administered 2011-12-09 (×2): via INTRAVENOUS

## 2011-12-09 MED ORDER — LACTATED RINGERS IV SOLN
INTRAVENOUS | Status: DC
  Administered 2011-12-09: 14:00:00 via INTRAVENOUS

## 2011-12-09 MED ORDER — MIDAZOLAM HCL 2 MG/2ML IJ SOLN
1.0000 mg | INTRAMUSCULAR | Status: DC | PRN
  Administered 2011-12-09: 2 mg via INTRAVENOUS

## 2011-12-09 MED ORDER — ONDANSETRON HCL 4 MG/2ML IJ SOLN
INTRAMUSCULAR | Status: DC | PRN
  Administered 2011-12-09: 4 mg via INTRAVENOUS

## 2011-12-09 MED ORDER — POTASSIUM CHLORIDE 2 MEQ/ML IV SOLN
INTRAVENOUS | Status: DC
  Administered 2011-12-09: 23:00:00 via INTRAVENOUS
  Filled 2011-12-09 (×3): qty 1000

## 2011-12-09 MED ORDER — FENTANYL CITRATE 0.05 MG/ML IJ SOLN
50.0000 ug | Freq: Once | INTRAMUSCULAR | Status: AC
  Administered 2011-12-09: 50 ug via INTRAVENOUS

## 2011-12-09 MED ORDER — LIDOCAINE HCL (CARDIAC) 20 MG/ML IV SOLN
INTRAVENOUS | Status: DC | PRN
  Administered 2011-12-09: 60 mg via INTRAVENOUS

## 2011-12-09 MED ORDER — HYDROMORPHONE HCL PF 1 MG/ML IJ SOLN
0.2500 mg | INTRAMUSCULAR | Status: DC | PRN
  Administered 2011-12-09 (×3): 0.5 mg via INTRAVENOUS

## 2011-12-09 MED ORDER — CEFAZOLIN SODIUM-DEXTROSE 2-3 GM-% IV SOLR
INTRAVENOUS | Status: AC
  Administered 2011-12-09: 2 g via INTRAVENOUS
  Filled 2011-12-09: qty 50

## 2011-12-09 MED ORDER — PROPOFOL 10 MG/ML IV BOLUS
INTRAVENOUS | Status: DC | PRN
  Administered 2011-12-09: 200 mg via INTRAVENOUS

## 2011-12-09 MED ORDER — HYDROCODONE-ACETAMINOPHEN 5-325 MG PO TABS
1.0000 | ORAL_TABLET | Freq: Once | ORAL | Status: AC
  Administered 2011-12-09: 1 via ORAL

## 2011-12-09 MED ORDER — ROCURONIUM BROMIDE 100 MG/10ML IV SOLN
INTRAVENOUS | Status: DC | PRN
  Administered 2011-12-09: 30 mg via INTRAVENOUS

## 2011-12-09 MED ORDER — HYDROCODONE-ACETAMINOPHEN 5-325 MG PO TABS
ORAL_TABLET | ORAL | Status: AC
  Filled 2011-12-09: qty 1

## 2011-12-09 MED ORDER — ARTIFICIAL TEARS OP OINT
TOPICAL_OINTMENT | OPHTHALMIC | Status: DC | PRN
  Administered 2011-12-09: 1 via OPHTHALMIC

## 2011-12-09 MED ORDER — PHENYLEPHRINE HCL 10 MG/ML IJ SOLN
INTRAMUSCULAR | Status: DC | PRN
  Administered 2011-12-09 (×5): 40 ug via INTRAVENOUS

## 2011-12-09 MED ORDER — FENTANYL CITRATE 0.05 MG/ML IJ SOLN
INTRAMUSCULAR | Status: DC | PRN
  Administered 2011-12-09 (×3): 50 ug via INTRAVENOUS

## 2011-12-09 MED ORDER — FENTANYL CITRATE 0.05 MG/ML IJ SOLN
50.0000 ug | INTRAMUSCULAR | Status: DC | PRN
  Administered 2011-12-09: 100 ug via INTRAVENOUS

## 2011-12-09 MED ORDER — MIDAZOLAM HCL 2 MG/2ML IJ SOLN: 1.0000 mg | INTRAMUSCULAR | Status: DC | PRN

## 2011-12-09 MED ORDER — POTASSIUM CHLORIDE 2 MEQ/ML IV SOLN: INTRAVENOUS | Status: DC

## 2011-12-09 MED ORDER — 0.9 % SODIUM CHLORIDE (POUR BTL) OPTIME
TOPICAL | Status: DC | PRN
  Administered 2011-12-09: 1000 mL

## 2011-12-09 MED ORDER — NEOSTIGMINE METHYLSULFATE 1 MG/ML IJ SOLN
INTRAMUSCULAR | Status: DC | PRN
  Administered 2011-12-09: 3 mg via INTRAVENOUS

## 2011-12-09 MED ORDER — TECHNETIUM TC 99M SULFUR COLLOID FILTERED
1.0000 | Freq: Once | INTRAVENOUS | Status: AC | PRN
  Administered 2011-12-09: 1 via INTRADERMAL

## 2011-12-09 MED ORDER — MIDAZOLAM HCL 5 MG/5ML IJ SOLN
INTRAMUSCULAR | Status: DC | PRN
  Administered 2011-12-09: 1 mg via INTRAVENOUS

## 2011-12-09 MED ORDER — ONDANSETRON HCL 4 MG/2ML IJ SOLN
4.0000 mg | Freq: Four times a day (QID) | INTRAMUSCULAR | Status: DC | PRN
  Administered 2011-12-09 – 2011-12-10 (×2): 4 mg via INTRAVENOUS
  Filled 2011-12-09 (×2): qty 2

## 2011-12-09 MED ORDER — HYDROMORPHONE HCL PF 1 MG/ML IJ SOLN
INTRAMUSCULAR | Status: AC
  Filled 2011-12-09: qty 1

## 2011-12-09 MED ORDER — BUPIVACAINE HCL (PF) 0.25 % IJ SOLN
INTRAMUSCULAR | Status: DC | PRN
  Administered 2011-12-09: 10 mL

## 2011-12-09 MED ORDER — HYDROCODONE-ACETAMINOPHEN 5-325 MG PO TABS: 1.0000 | ORAL_TABLET | Freq: Four times a day (QID) | ORAL | Status: DC | PRN

## 2011-12-09 SURGICAL SUPPLY — 43 items
ADH SKN CLS APL DERMABOND .7 (GAUZE/BANDAGES/DRESSINGS) ×1
APPLIER CLIP 9.375 MED OPEN (MISCELLANEOUS) ×2
APPLIER CLIP 9.375 SM OPEN (CLIP)
APR CLP MED 9.3 20 MLT OPN (MISCELLANEOUS) ×1
APR CLP SM 9.3 20 MLT OPN (CLIP)
BINDER BREAST LRG (GAUZE/BANDAGES/DRESSINGS) ×2 IMPLANT
BINDER BREAST XLRG (GAUZE/BANDAGES/DRESSINGS) IMPLANT
CANISTER SUCTION 2500CC (MISCELLANEOUS) ×2 IMPLANT
CHLORAPREP W/TINT 26ML (MISCELLANEOUS) ×2 IMPLANT
CLIP APPLIE 9.375 MED OPEN (MISCELLANEOUS) ×1 IMPLANT
CLIP APPLIE 9.375 SM OPEN (CLIP) IMPLANT
CLIP TI WIDE RED SMALL 6 (CLIP) ×4 IMPLANT
CLOTH BEACON ORANGE TIMEOUT ST (SAFETY) ×2 IMPLANT
CONT SPEC 4OZ CLIKSEAL STRL BL (MISCELLANEOUS) ×2 IMPLANT
COVER PROBE W GEL 5X96 (DRAPES) ×2 IMPLANT
COVER SURGICAL LIGHT HANDLE (MISCELLANEOUS) ×2 IMPLANT
DERMABOND ADVANCED (GAUZE/BANDAGES/DRESSINGS) ×1
DERMABOND ADVANCED .7 DNX12 (GAUZE/BANDAGES/DRESSINGS) ×1 IMPLANT
DEVICE DUBIN SPECIMEN MAMMOGRA (MISCELLANEOUS) ×2 IMPLANT
DRAPE CHEST BREAST 15X10 FENES (DRAPES) ×2 IMPLANT
DRAPE PROXIMA HALF (DRAPES) ×2 IMPLANT
DRAPE UTILITY 15X26 W/TAPE STR (DRAPE) ×4 IMPLANT
ELECT COATED BLADE 2.86 ST (ELECTRODE) ×2 IMPLANT
ELECT REM PT RETURN 9FT ADLT (ELECTROSURGICAL) ×2
ELECTRODE REM PT RTRN 9FT ADLT (ELECTROSURGICAL) ×1 IMPLANT
GLOVE SURG SIGNA 7.5 PF LTX (GLOVE) ×4 IMPLANT
GOWN STRL NON-REIN LRG LVL3 (GOWN DISPOSABLE) ×2 IMPLANT
GOWN STRL REIN XL XLG (GOWN DISPOSABLE) ×2 IMPLANT
KIT BASIN OR (CUSTOM PROCEDURE TRAY) ×2 IMPLANT
KIT MARKER MARGIN INK (KITS) ×2 IMPLANT
KIT ROOM TURNOVER OR (KITS) ×2 IMPLANT
NEEDLE 18GX1X1/2 (RX/OR ONLY) (NEEDLE) ×2 IMPLANT
NEEDLE HYPO 25GX1X1/2 BEV (NEEDLE) ×4 IMPLANT
NS IRRIG 1000ML POUR BTL (IV SOLUTION) ×2 IMPLANT
PACK GENERAL/GYN (CUSTOM PROCEDURE TRAY) ×2 IMPLANT
PAD ARMBOARD 7.5X6 YLW CONV (MISCELLANEOUS) ×2 IMPLANT
SPONGE GAUZE 4X4 12PLY (GAUZE/BANDAGES/DRESSINGS) ×2 IMPLANT
STAPLER VISISTAT 35W (STAPLE) IMPLANT
SUT MON AB 5-0 PS2 18 (SUTURE) ×2 IMPLANT
SUT VIC AB 3-0 SH 18 (SUTURE) ×2 IMPLANT
SYR CONTROL 10ML LL (SYRINGE) ×4 IMPLANT
TOWEL OR 17X24 6PK STRL BLUE (TOWEL DISPOSABLE) ×2 IMPLANT
TOWEL OR 17X26 10 PK STRL BLUE (TOWEL DISPOSABLE) ×2 IMPLANT

## 2011-12-09 NOTE — Op Note (Signed)
12/09/2011  5:13 PM  PATIENT:  Kristin Mills, 56 y.o., female, MRN: [id number]  PREOP DIAGNOSIS:  right breast cancer  POSTOP DIAGNOSIS:   DCIS/LCIS right breast, subareolar at 3 o'clock position (Tis, N0)  PROCEDURE:   Procedure(s): Right BREAST LUMPECTOMY WITH NEEDLE LOCALIZATION AND right AXILLARY SENTINEL LYMPH NODE BX, Injection of peri areolar methylene blue  SURGEON:   Ovidio Kin, M.D.  ANESTHESIA:   general  Hart Robinsons, MD - Anesthesiologist Kristopher Key, CRNA - CRNA Rosalio Macadamia, CRNA - CRNA  General  EBL:  <50  ml  DRAINS: none   LOCAL MEDICATIONS USED:   30 cc 1/4% marcaine  SPECIMEN:   Right axillary sentinel lymph node (blue and counts 1400/20), right breast lumpectomy  COUNTS CORRECT:  YES  INDICATIONS FOR PROCEDURE:  Kristin Mills is a 56 y.o. (DOB: November 11, 1955) white female whose primary care physician is [name], MD and comes for right breast lumpectomy and right axillary sentinel lymph node biopsy.   Options for breast cancer treatment were discussed with the patient. She elected to proceed  with lumpectomy and axillary sentinel lymph node.     The indications and potential complications of surgery were explained to the patient. Potential complications include, but are not limited to, bleeding, infection, the need for further surgery, and nerve injury.     The patient had a needle loc wire placed at the 6 o'clock position of the right breast at [facility] by Dr. Clayborn Bigness.  In the holding area, her right areola was injected with 1 millicurie of Technitium Sulfur Colloid.   OPERATIVE NOTE:  The patient was taken to room # 9 at [facility] where she underwent a general anesthesia  supervised by Hart Robinsons, MD - Anesthesiologist Kristopher Key, CRNA - CRNA Rosalio Macadamia, CRNA - CRNA. Her right breast and axilla were prepped with  ChloraPrep and sterilely draped.   A time-out and the surgical check list was reviewed.   I injected  about 0.5 mL of 40% methylene blue around her right areola.   I started with the right axillary sentinel lymph node biopsy. I found a hot area at the junction of the breast and the pectoralis major muscle. I cut down and  identified a hot node that had counts of 1400 and the background has 20 counts. The lymph node was blue. I checked her internal mammary nodes and supraclavicular nodes with the neoprobe and found no other hot area. The axillary node was then sent to pathology.   I turned attention to the cancer which was subareolar and at the 3 o'clock position of the right breast. I cut down around the wire and tried to take an ellipse of breast tissue around the tumor by at least 1 cm.   I excised this block of breast tissue approximately 4 cm by 5 cm  in diameter. I did take the dissection down to the pectoralis major. I painted this specimen with the 6 color paint kit and did a specimen mammogram which confirmed the mass, clip and the wire were all in the right position.  She had a mass (probable hematoma) excised with the wire.  I then irrigated the wound with saline. I infiltrated approximately 30 mL of 1% local between the  Incisions.  I placed 6 clips to mark biopsy cavity, at 12, 3, 6, and 9 o'clock. Two clips were placed on the pectoralis major.   I then closed all the wounds in layers using 3-0 Vicryl  sutures for the deep layer. At the skin, I closed the incisions with a 5-0 Monocryl suture. The incisions were then painted with Dermabond.  She had gauze place over the wounds and placed in a breast binder.  The patient tolerated the procedure well, was transported to the recovery room in good condition. Sponge and needle count were correct at the end of the case.   Final pathology is pending.  Plan discharge home today.   Ovidio Kin, MD, [facility] Pager: [phone] Office phone:  [phone]

## 2011-12-09 NOTE — Progress Notes (Signed)
DR BYERLY NOTIFIED OF N&V AND ORDERS NOTED

## 2011-12-09 NOTE — Interval H&P Note (Signed)
History and Physical Interval Note:  12/09/2011 2:56 PM  Maricia Mesmer  has presented today for surgery, with the diagnosis of right breast cancer  The various methods of treatment have been discussed with the patient and family.  Her husband is here with the patient.  After consideration of risks, benefits and other options for treatment, the patient has consented to  Procedure(s) (LRB) with comments: BREAST LUMPECTOMY WITH NEEDLE LOCALIZATION AND AXILLARY SENTINEL LYMPH NODE BX (Right) as a surgical intervention .    The patient's history has been reviewed, patient examined, no change in status, stable for surgery.  I have reviewed the patient's chart and labs.  Questions were answered to the patient's satisfaction.     Maxton Noreen H

## 2011-12-09 NOTE — Anesthesia Preprocedure Evaluation (Signed)
Anesthesia Evaluation  Patient identified by MRN, date of birth, ID band Patient awake    Reviewed: Allergy & Precautions, H&P , NPO status , Patient's Chart, lab work & pertinent test results, reviewed documented beta blocker date and time   Airway Mallampati: II TM Distance: >3 FB Neck ROM: full    Dental   Pulmonary neg pulmonary ROS,  breath sounds clear to auscultation        Cardiovascular negative cardio ROS  Rhythm:regular     Neuro/Psych  Headaches, negative psych ROS   GI/Hepatic negative GI ROS, Neg liver ROS,   Endo/Other  negative endocrine ROS  Renal/GU negative Renal ROS  negative genitourinary   Musculoskeletal   Abdominal   Peds  Hematology negative hematology ROS (+)   Anesthesia Other Findings See surgeon's H&P   Reproductive/Obstetrics negative OB ROS                           Anesthesia Physical Anesthesia Plan  ASA: II  Anesthesia Plan: General   Post-op Pain Management:    Induction: Intravenous  Airway Management Planned: LMA  Additional Equipment:   Intra-op Plan:   Post-operative Plan: Extubation in OR  Informed Consent: I have reviewed the patients History and Physical, chart, labs and discussed the procedure including the risks, benefits and alternatives for the proposed anesthesia with the patient or authorized representative who has indicated his/her understanding and acceptance.   Dental Advisory Given  Plan Discussed with: CRNA and Surgeon  Anesthesia Plan Comments:         Anesthesia Quick Evaluation  

## 2011-12-09 NOTE — H&P (View-Only) (Signed)
 Re:   Kristin Mills:   10/14/1955 MRN:   [id number]  [facility]  ASSESSMENT AND PLAN: 1.  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 She come to [facility].  I discussed the surgical options of lumpectomy vs. mastectomy.  If mastectomy, there is the possibility of reconstruction.   I discussed the options of lymph node biopsy.  The treatment plan depends on the pathologic staging of the tumor and the patient's personal wishes.  The risks of surgery include, but are not limited to, bleeding, infection, the need for further surgery, and nerve injury.  The patient has been given literature on the treatment of breast cancer.  Treating oncologists:  Rubin and Wentworth  Will hold off on surgery for 2 to 4 weeks to allow right breast hematoma to resolve.  Plan:  1. Right breast needle loc lumpectomy and right axillary SLNBx,  2.  Rad tx,  3. Antiestrogen tx  2.  Lupus anticoagulant positive 3.  Hypercholesterolemia 4.  On aspirin - to stop one week ahead 5.  History of narrow angle glaucoma - treated  No chief complaint on file.  REFERRING PHYSICIAN:  Dr. James Kim  HISTORY OF PRESENT ILLNESS: Kristin Mills is a 56 y.o. (Mills: 02/18/1955)  white female whose primary care physician is Dr. James Kim  and [facility].  She has no family history of breast cancer.  She did wear a Vivelle hormone patch for the past 5 years.  She sees Dr. Lomax.  She had a hysterectomy 1999 by Dr. S. Smith for fibroids.  She had a right breast biopsy for microcalcifications.  She felt nothing abnormal in her breast.  She had a mammogram on 10/27/2011 that showed microcalcifications in the right breast.  This lead to a biopsy of the right breast.  The path is interesting in that it shows LCIS, DCIS, and possible invasive lobular carcinoma. Her MRI 11/14/2011 showed only  post biopsy changes.   No past medical history on file.   No past surgical history on file.    Current Outpatient Prescriptions  Medication Sig Dispense Refill  . aspirin 81 MG tablet Take 81 mg by mouth daily.      . cyanocobalamin 500 MCG tablet Take 500 mcg by mouth daily.      . fish oil-omega-3 fatty acids 1000 MG capsule Take 2 g by mouth daily.      . loratadine (CLARITIN REDITABS) 10 MG dissolvable tablet Take 10 mg by mouth daily.      . simvastatin (ZOCOR) 20 MG tablet Take 20 mg by mouth daily.         No Known Allergies  REVIEW OF SYSTEMS: Skin:  No history of rash.  No history of abnormal moles. Infection:  No history of hepatitis or HIV.  No history of MRSA. Neurologic:  No history of stroke.  No history of seizure.  No history of headaches. Cardiac:  No history of hypertension. No history of heart disease.  No history of prior cardiac catheterization.  No history of seeing a cardiologist. Pulmonary:  Does not smoke cigarettes.  No asthma or bronchitis.  No OSA/CPAP.  Endocrine:  No diabetes. No thyroid disease. Gastrointestinal:  No history of stomach disease.  No history of liver disease.  No history of gall bladder disease.  No history of pancreas disease.    No history of colon disease. Urologic:  No history of kidney stones.  No history of bladder infections. GYN:  Hysterectomy 1999 by Dr. S. Smith for fibroids.  Stopped Vivelle patch recently. Musculoskeletal:  No history of joint or back disease. Hematologic:  Bruises easily and is tested positive for lupus anti-coagulant (leads to an increase propensity to clot). Psycho-social:  The patient is oriented.   The patient has no obvious psychologic or social impairment to understanding our conversation and plan.  SOCIAL and FAMILY HISTORY: Married. Husband with her. She works part time marketing - [employer] Has 2 children 37 and 25.  PHYSICAL EXAM: There were no vitals taken for this visit.  General: WN  WF who is alert and generally healthy appearing.  HEENT: Normal. Pupils equal. Neck: Supple. No mass.  No thyroid mass. Lymph Nodes:  No supraclavicular, cervical nodes, or axillary nodes. Lungs: Clear to auscultation and symmetric breath sounds. Heart:  RRR. No murmur or rub. Breast:  Right - Bruise and 3 x 4 cm mass in lower breast.  Left - unremarkable.  Abdomen: Soft. No mass. No tenderness. No hernia. Normal bowel sounds.  No abdominal scars. Rectal: Not done. Extremities:  Good strength and ROM  in upper and lower extremities. Neurologic:  Grossly intact to motor and sensory function. Psychiatric: Has normal mood and affect. Behavior is normal.   DATA REVIEWED: X-rays and path.  Path to patient.  Aaro Meyers, MD,  FACS [facility] [address]  [address]   [place],     [address] Phone:  [phone] FAX:  [phone]  

## 2011-12-09 NOTE — Preoperative (Signed)
Beta Blockers   Reason not to administer Beta Blockers:Not Applicable 

## 2011-12-09 NOTE — Transfer of Care (Signed)
Immediate Anesthesia Transfer of Care Note  Patient: Kristin Mills  Procedure(s) Performed: Procedure(s) (LRB) with comments: BREAST LUMPECTOMY WITH NEEDLE LOCALIZATION AND AXILLARY SENTINEL LYMPH NODE BX (Right)  Patient Location: PACU  Anesthesia Type:General  Level of Consciousness: awake, oriented and patient cooperative  Airway & Oxygen Therapy: Patient Spontanous Breathing and Patient connected to nasal cannula oxygen  Post-op Assessment: Report given to PACU RN and Post -op Vital signs reviewed and stable  Post vital signs: Reviewed and stable  Complications: No apparent anesthesia complications

## 2011-12-09 NOTE — Progress Notes (Signed)
NAUSEA AND VOMITING OF 300CC CLEAR LIQUID

## 2011-12-09 NOTE — Anesthesia Postprocedure Evaluation (Signed)
  Anesthesia Post-op Note  Patient: Kristin Mills  Procedure(s) Performed: Procedure(s) (LRB) with comments: BREAST LUMPECTOMY WITH NEEDLE LOCALIZATION AND AXILLARY SENTINEL LYMPH NODE BX (Right)  Patient Location: PACU  Anesthesia Type:General  Level of Consciousness: awake  Airway and Oxygen Therapy: Patient Spontanous Breathing  Post-op Pain: mild  Post-op Assessment: Post-op Vital signs reviewed  Post-op Vital Signs: Reviewed  Complications: No apparent anesthesia complications

## 2011-12-10 ENCOUNTER — Encounter (HOSPITAL_COMMUNITY): Payer: Self-pay | Admitting: *Deleted

## 2011-12-10 NOTE — Discharge Summary (Signed)
Physician Discharge Summary  Patient ID:  Kristin Mills  MRN: [id number]  DOB/AGE: 25-Jan-1955 56 y.o.  Admit date: 12/09/2011 Discharge date: 12/10/2011  Discharge Diagnoses:   1. Right breast cancer - DCIS and LCIS - final path pending  2.  Post op nausea/vomiting - better  3. Lupus anticoagulant positive   4. Hypercholesterolemia   5. History of narrow angle glaucoma - treated  Operation: Procedure(s): Right BREAST LUMPECTOMY WITH NEEDLE LOCALIZATION AND AXILLARY SENTINEL LYMPH NODE BX on 12/09/2011  Discharged Condition: good  Hospital Course: Kristin Mills is an 56 y.o. female whose primary care physician is [name], MD and who was admitted 12/09/2011 with a chief complaint of nausea and vomiting post right breast lumpectomy.  She has a diagnosis of right breast cancer. She was brought to the operating room on 12/09/2011 and underwent a right BREAST LUMPECTOMY WITH NEEDLE LOCALIZATION AND AXILLARY SENTINEL LYMPH NODE BX .   Post op she developed severe nausea and vomiting and required admission for IV hydration and support.  She is doing much better this AM.  Her husband is in the room.  Consults: None  Significant Diagnostic Studies: Results for orders placed during the hospital encounter of 12/03/11  SURGICAL PCR SCREEN      Component Value Range   MRSA, PCR NEGATIVE  NEGATIVE   Staphylococcus aureus NEGATIVE  NEGATIVE  CBC      Component Value Range   WBC 5.1  4.0 - 10.5 K/uL   RBC 4.54  3.87 - 5.11 MIL/uL   Hemoglobin 13.5  12.0 - 15.0 g/dL   HCT 40.9  81.1 - 91.4 %   MCV 87.2  78.0 - 100.0 fL   MCH 29.7  26.0 - 34.0 pg   MCHC 34.1  30.0 - 36.0 g/dL   RDW 78.2  95.6 - 21.3 %   Platelets 202  150 - 400 K/uL   Discharge Exam:  Filed Vitals:   12/10/11 0534  BP: 110/67  Pulse: 56  Temp: 97.8 F (36.6 C)  Resp: 18    General: WN WF who is alert and generally healthy appearing.  Lungs: Clear to auscultation and symmetric breath sounds. Heart:  RRR. No  murmur or rub. Chest:  Dressing looks good.  Moves right arm without pain.  Discharge Medications:     Medication List     As of 12/10/2011  8:29 AM    TAKE these medications         aspirin EC 81 MG tablet   Take 81 mg by mouth daily.      CLARITIN REDITABS 5 MG Tbdp   Generic drug: Loratadine   Take 5 mg by mouth daily as needed. For allergies      Cyanocobalamin 5000 MCG Subl   Place 5,000 mcg under the tongue daily.      FISH OIL TRIPLE STRENGTH 1400 MG Caps   Take 1,400 mg by mouth daily.      HYDROcodone-acetaminophen 5-325 MG per tablet   Commonly known as: NORCO/VICODIN   Take 1-2 tablets by mouth every 6 (six) hours as needed for pain.      simvastatin 20 MG tablet   Commonly known as: ZOCOR   Take 20 mg by mouth daily.        Disposition:       Discharge Orders    Future Appointments: Provider: Department: Dept Phone: Center:   12/23/2011 10:00 AM Krista Blue [facility] MEDICAL ONCOLOGY [phone] None  12/23/2011 10:30 AM Pierce Crane, MD [facility] MEDICAL ONCOLOGY [phone] None   12/24/2011 12:45 PM Kandis Cocking, MD [facility], [place] [phone] None   12/31/2011 2:00 PM [facility] [phone] None   12/31/2011 2:30 PM Lurline Hare, MD  [facility] [phone] None     Future Orders Please Complete By Expires   Diet - low sodium heart healthy      Diet - low sodium heart healthy      Increase activity slowly      Increase activity slowly        Discharge Instructions: Activity:  Driving - May drive tomorrow, if doing well.   Lifting - No lifting >15 pounds for one week.  But okay to do any range of motion of the right arm.  Wound Care:   Leave bandage x 3 days, then remove and shower.  Diet:  As tolerated.  Follow up appointment:  Call Dr. Allene Pyo office [facility]) at [phone] for an  appointment in 2 weeks.  Medications and dosages:  Resume your home medications.  You have a prescription for pain:  Vicodin.   And you have a prescription for nausea: Phenergan supp    Signed: Ovidio Kin, M.D., FACS  12/10/2011, 8:29 AM

## 2011-12-11 ENCOUNTER — Encounter (HOSPITAL_COMMUNITY): Payer: Self-pay | Admitting: Surgery

## 2011-12-12 ENCOUNTER — Telehealth (INDEPENDENT_AMBULATORY_CARE_PROVIDER_SITE_OTHER): Payer: Self-pay

## 2011-12-12 NOTE — Telephone Encounter (Signed)
Pt called for her path result. Path result given to pt and she will keep her po appt to discuss any future follow up. Pt advised importance of keeping this appt. Pt states she understands.

## 2011-12-23 ENCOUNTER — Other Ambulatory Visit (HOSPITAL_BASED_OUTPATIENT_CLINIC_OR_DEPARTMENT_OTHER): Payer: 59

## 2011-12-23 ENCOUNTER — Telehealth: Payer: Self-pay | Admitting: *Deleted

## 2011-12-23 ENCOUNTER — Other Ambulatory Visit: Payer: Self-pay | Admitting: *Deleted

## 2011-12-23 ENCOUNTER — Ambulatory Visit (HOSPITAL_BASED_OUTPATIENT_CLINIC_OR_DEPARTMENT_OTHER): Payer: 59 | Admitting: Oncology

## 2011-12-23 VITALS — BP 132/79 | HR 85 | Temp 98.1°F | Resp 20 | Ht 68.0 in | Wt 168.0 lb

## 2011-12-23 DIAGNOSIS — C50319 Malignant neoplasm of lower-inner quadrant of unspecified female breast: Secondary | ICD-10-CM

## 2011-12-23 DIAGNOSIS — D0590 Unspecified type of carcinoma in situ of unspecified breast: Secondary | ICD-10-CM

## 2011-12-23 LAB — CBC WITH DIFFERENTIAL/PLATELET
BASO%: 0.4 % (ref 0.0–2.0)
Basophils Absolute: 0 10*3/uL (ref 0.0–0.1)
EOS%: 2.7 % (ref 0.0–7.0)
Eosinophils Absolute: 0.1 10*3/uL (ref 0.0–0.5)
HCT: 39.7 % (ref 34.8–46.6)
HGB: 13.4 g/dL (ref 11.6–15.9)
LYMPH%: 28.5 % (ref 14.0–49.7)
MCH: 30 pg (ref 25.1–34.0)
MCHC: 33.7 g/dL (ref 31.5–36.0)
MCV: 89.2 fL (ref 79.5–101.0)
MONO#: 0.2 10*3/uL (ref 0.1–0.9)
MONO%: 4.3 % (ref 0.0–14.0)
NEUT#: 2.7 10*3/uL (ref 1.5–6.5)
NEUT%: 64.1 % (ref 38.4–76.8)
Platelets: 182 10*3/uL (ref 145–400)
RBC: 4.45 10*6/uL (ref 3.70–5.45)
RDW: 14.5 % (ref 11.2–14.5)
WBC: 4.2 10*3/uL (ref 3.9–10.3)
lymph#: 1.2 10*3/uL (ref 0.9–3.3)

## 2011-12-23 LAB — COMPREHENSIVE METABOLIC PANEL (CC13)
ALT: 19 U/L (ref 0–55)
AST: 20 U/L (ref 5–34)
Albumin: 3.9 g/dL (ref 3.5–5.0)
Alkaline Phosphatase: 61 U/L (ref 40–150)
BUN: 18 mg/dL (ref 7.0–26.0)
CO2: 26 mEq/L (ref 22–29)
Calcium: 9.4 mg/dL (ref 8.4–10.4)
Chloride: 106 mEq/L (ref 98–107)
Creatinine: 0.9 mg/dL (ref 0.6–1.1)
Glucose: 128 mg/dl — ABNORMAL HIGH (ref 70–99)
Potassium: 4.2 mEq/L (ref 3.5–5.1)
Sodium: 142 mEq/L (ref 136–145)
Total Bilirubin: 1.07 mg/dL (ref 0.20–1.20)
Total Protein: 6.8 g/dL (ref 6.4–8.3)

## 2011-12-23 NOTE — Telephone Encounter (Signed)
Gave patient appointment for 03-10-2012 starting at 9:00

## 2011-12-24 ENCOUNTER — Encounter (INDEPENDENT_AMBULATORY_CARE_PROVIDER_SITE_OTHER): Payer: Self-pay | Admitting: Surgery

## 2011-12-24 ENCOUNTER — Encounter: Payer: Self-pay | Admitting: *Deleted

## 2011-12-24 ENCOUNTER — Ambulatory Visit (INDEPENDENT_AMBULATORY_CARE_PROVIDER_SITE_OTHER): Payer: Medicare HMO | Admitting: Surgery

## 2011-12-24 VITALS — BP 128/72 | HR 74 | Temp 98.1°F | Resp 18 | Ht 68.0 in | Wt 165.0 lb

## 2011-12-24 DIAGNOSIS — C50319 Malignant neoplasm of lower-inner quadrant of unspecified female breast: Secondary | ICD-10-CM

## 2011-12-24 NOTE — Progress Notes (Signed)
Re:   Kristin Mills DOB:   03/17/1955 MRN:   [id number]  [facility]  ASSESSMENT AND PLAN: 1.  Right breast cancer, LIQ.  Tis, N0.  Stage 0.  DCIS and LCIS.  Final path - no residual cancer, 0/4 nodes.  Lumpectomy and SLNBx - 12/09/2011 - no residual cancer.  Treating oncologists:  Donnie Coffin and Michell Heinrich.  She saw Dr. Donnie Coffin yesterday.  Plan:  1. Right breast needle loc lumpectomy and right axillary SLNBx (done),  2.  Rad tx,  3. Antiestrogen tx.  Is doing well.  I gave her a prescription for the ABC class.  Return to see me in 6 months (I talked about alternating with Dr. Donnie Coffin)  2.  Lupus anticoagulant positive 3.  Hypercholesterolemia 4.  On aspirin 5.  History of narrow angle glaucoma - treated  Chief Complaint  Patient presents with  . Routine Post Op    Breast lumpectomy w/needle localization, axillary slnbx 12/09/11   REFERRING PHYSICIAN:  Dr. [name]  HISTORY OF PRESENT ILLNESS: Kristin Mills is a 56 y.o. (DOB: 02-15-1955)  white female whose primary care physician is Dr. [name].  She comes for follow up of a right breast lumpectomy/SLNBx.  She has done well.  She has a little soreness under her right axilla.    History of Breast Cancer: She has no family history of breast cancer.  She did wear a Vivelle hormone patch for the past 5 years.  She sees Dr. Nicholas Lose.  She had a hysterectomy 1999 by Dr. Michaelle Copas for fibroids.  She had a right breast biopsy for microcalcifications.  She felt nothing abnormal in her breast.  She had a mammogram on 10/27/2011 that showed microcalcifications in the right breast.  This lead to a biopsy of the right breast.  The path is interesting in that it shows LCIS, DCIS, and possible invasive lobular carcinoma. Her MRI 11/14/2011 showed only post biopsy changes.    Past Medical History  Diagnosis Date  . Breast cancer   . Skin cancer     basal cell carcinoma  . Headache     sinus      Past Surgical History  Procedure Date  . Abdominal  hysterectomy   . Peripheral iridotomy 2009    bilateral  . Dilation and curettage of uterus   . Ganglion cyst excision     right  . Breast lumpectomy with needle localization and axillary sentinel lymph node bx 12/09/2011    Procedure: BREAST LUMPECTOMY WITH NEEDLE LOCALIZATION AND AXILLARY SENTINEL LYMPH NODE BX;  Surgeon: Kandis Cocking, MD;  Location: [facility] OR;  Service: General;  Laterality: Right;      Current Outpatient Prescriptions  Medication Sig Dispense Refill  . aspirin EC 81 MG tablet Take 81 mg by mouth daily.      . Cyanocobalamin 5000 MCG SUBL Place 5,000 mcg under the tongue daily.      . Loratadine (CLARITIN REDITABS) 5 MG TBDP Take 5 mg by mouth daily as needed. For allergies      . Omega-3 Fatty Acids (FISH OIL TRIPLE STRENGTH) 1400 MG CAPS Take 1,400 mg by mouth daily.      . simvastatin (ZOCOR) 20 MG tablet Take 20 mg by mouth daily.         No Known Allergies  REVIEW OF SYSTEMS: GYN:  Hysterectomy 1999 by Dr. Michaelle Copas for fibroids.  Stopped Vivelle patch recently. Musculoskeletal:  No history of joint or back disease. Hematologic:  Bruises  easily and is tested positive for lupus anti-coagulant (leads to an increase propensity to clot).  SOCIAL and FAMILY HISTORY: Married. Husband with her. 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  PHYSICAL EXAM: BP 128/72  Pulse 74  Temp 98.1 F (36.7 C) (Temporal)  Resp 18  Ht 5\' 8"  (1.727 m)  Wt 165 lb (74.844 kg)  BMI 25.09 kg/m2  General: WN WF who is alert and generally healthy appearing.  HEENT: Normal. Pupils equal. Neck: Supple. No mass.  No thyroid mass. Lymph Nodes:  No supraclavicular, cervical nodes, or axillary nodes.  Right axillary wound looks good. Breast:  Right - Wound at 6 o'clock looks good.  Left - unremarkable.  DATA REVIEWED: Path to patient.  Ovidio Kin, MD,  [facility] [address] [place]  [address]   [place]    [address] Phone:  [phone] FAX:  [phone]

## 2011-12-31 ENCOUNTER — Ambulatory Visit
Admission: RE | Admit: 2011-12-31 | Discharge: 2011-12-31 | Disposition: A | Discharge status: Home / Self Care | Payer: 59 | Source: Ambulatory Visit | Attending: Radiation Oncology | Admitting: Radiation Oncology

## 2011-12-31 ENCOUNTER — Encounter: Payer: Self-pay | Admitting: Radiation Oncology

## 2011-12-31 VITALS — BP 114/72 | HR 84 | Temp 97.6°F | Resp 18 | Ht 68.0 in | Wt 168.7 lb

## 2011-12-31 DIAGNOSIS — C50319 Malignant neoplasm of lower-inner quadrant of unspecified female breast: Secondary | ICD-10-CM

## 2011-12-31 DIAGNOSIS — D0590 Unspecified type of carcinoma in situ of unspecified breast: Secondary | ICD-10-CM | POA: Insufficient documentation

## 2011-12-31 NOTE — Progress Notes (Signed)
See progress note under physician encounter. 

## 2011-12-31 NOTE — Progress Notes (Signed)
Patient presents to the clinic today accompanied by her husband for a consult with Dr. Michell Heinrich. Patient is alert and oriented to person, place, and time. No distress noted. Steady gait noted. Pleasant affect noted. Patient denies pain at this time. Patient reports that right breast under axilla continues to be sore. Patient scheduled for ABC on Monday. Patient reports that she has been doing the four exercises prescribed by surgeon as directed. Patient reports right breast incision is well approximated with redness, drainage or edema. No swelling of the right hand or arm noted. Patient denies nausea, vomiting, headache, dizziness, or night sweats. Patient concern about vitamin d level. Encouraged patient to follow up with PCP reference this concern. Patient reports eating and sleeping without difficulty. Patient has several questions and concerns about insurance coverage. Contacted Merla Riches reference these matters. Reported all findings to Dr. Michell Heinrich.

## 2011-12-31 NOTE — Addendum Note (Signed)
Encounter addended by: Yasuo Phimmasone Mintz Sylvi Rybolt, RN on: 12/31/2011  7:49 PM<BR>     Documentation filed: Charges VN

## 2011-12-31 NOTE — Progress Notes (Signed)
Complete PATIENT MEASURE OF DISTRESS worksheet with a score of 1 submitted to social work. 

## 2011-12-31 NOTE — Progress Notes (Signed)
56 year old female.  Menopause at the time of hysterectomy in 1999. HX of hormone replacement therapy (estrogen patches) for 5 years but, discontinued now.   S/P right breast lumpectomy with SLNBx on 12/09/2011. 0/4 nodes. No residual cancer. Right breast DCIS stage 0. Referred to ABC by [facility]. Seen by Dr. Michell Heinrich in [facility] on 11/19/11 and six weeks of radiation therapy was recommended four weeks after surgery.   PCP: Dirk Dress NKDA No hx of radiation therapy No indication of a pacemaker

## 2011-12-31 NOTE — Progress Notes (Signed)
   Department of Radiation Oncology  Phone:  [phone] Fax:        [phone]   Name: Kristin Mills   DOB: March 29, 1955  MRN: [id number]    Date: 12/31/2011  Follow Up Visit Note  Diagnosis: DCIS of the right breast  Interval History: Kristin Mills presents today for routine followup.  She tolerated her surgery well. She unfortunately had is benign the hospital after her pain medications cause her to have nausea and vomiting. She is healed up well however her back is back to work. She is somewhat concerned regarding her bills from her insurance company as we are currently out of network for [employer]. She's taken the ABC class on Monday. She is ready to begin radiation. She would like to start now and not wait and after the holidays. Interestingly on her surgical specimen there was no DCIS left behind. 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 Zero out of four lymph nodes were positive.  Allergies: No Known Allergies  Medications:  Current Outpatient Prescriptions  Medication Sig Dispense Refill  . aspirin EC 81 MG tablet Take 81 mg by mouth daily.      . Loratadine (CLARITIN REDITABS) 5 MG TBDP Take 5 mg by mouth daily as needed. For allergies      . simvastatin (ZOCOR) 20 MG tablet Take 20 mg by mouth daily.      . Cyanocobalamin 5000 MCG SUBL Place 5,000 mcg under the tongue daily.      . Omega-3 Fatty Acids (FISH OIL TRIPLE STRENGTH) 1400 MG CAPS Take 1,400 mg by mouth daily.        Physical Exam:   height is 5\' 8"  (1.727 m) and weight is 168 lb 11.2 oz (76.522 kg). Her oral temperature is 97.6 F (36.4 C). Her blood pressure is 114/72 and her pulse is 84. Her respiration is 18 and oxygen saturation is 100%.  She is pleasant female in no distress sitting comfortably examining table.  IMPRESSION: Kristin Mills is a 56 y.o. female status post lumpectomy for DCIS of the right breast with no residual DCIS found in the surgical specimen  PLAN:  I  discussed with [name] and her husband the results of her surgery and indications for radiation. We discussed that likely the changes seen in her lumpectomy were secondary to her use of estrogen. She has been off of her patch since her diagnosis. We discussed that her DCIS is very curable. We discussed the current controversy regarding which DCIS to treat. At this point it is still standard to treat with adjuvant radiation which is what I have recommended for her. We discussed the process of simulation the placement tattoos. We discussed lung damage. We discussed skin redness and fatigue. I will refer her to one of our financial counselors for discussion regarding her insurance payments. I assured her that the hospital was in network in she should contact her insurance company regarding coverage for our services.    Lurline Hare, MD

## 2012-01-01 ENCOUNTER — Ambulatory Visit
Admission: RE | Admit: 2012-01-01 | Discharge: 2012-01-01 | Disposition: A | Discharge status: Home / Self Care | Payer: 59 | Source: Ambulatory Visit | Attending: Radiation Oncology | Admitting: Radiation Oncology

## 2012-01-01 ENCOUNTER — Telehealth: Payer: Self-pay | Admitting: Radiation Oncology

## 2012-01-01 DIAGNOSIS — D0590 Unspecified type of carcinoma in situ of unspecified breast: Secondary | ICD-10-CM | POA: Insufficient documentation

## 2012-01-01 DIAGNOSIS — Z79899 Other long term (current) drug therapy: Secondary | ICD-10-CM | POA: Insufficient documentation

## 2012-01-01 DIAGNOSIS — Z7982 Long term (current) use of aspirin: Secondary | ICD-10-CM | POA: Insufficient documentation

## 2012-01-01 DIAGNOSIS — L988 Other specified disorders of the skin and subcutaneous tissue: Secondary | ICD-10-CM | POA: Insufficient documentation

## 2012-01-01 DIAGNOSIS — L259 Unspecified contact dermatitis, unspecified cause: Secondary | ICD-10-CM | POA: Insufficient documentation

## 2012-01-01 DIAGNOSIS — Z510 Encounter for antineoplastic radiation therapy: Secondary | ICD-10-CM | POA: Insufficient documentation

## 2012-01-01 DIAGNOSIS — C50319 Malignant neoplasm of lower-inner quadrant of unspecified female breast: Secondary | ICD-10-CM

## 2012-01-01 NOTE — Progress Notes (Signed)
Name: Kristin Mills   MRN: [id number]  Date:  01/01/2012  DOB: [birth year]  Status:outpatient    DIAGNOSIS: DCIS of the right breast  CONSENT VERIFIED: yes   SET UP: Patient is setup supine   IMMOBILIZATION:  The following immobilization was used:Custom Moldable Pillow, breast board.   NARRATIVE: Ms. Shiroma was brought to the CT Simulation planning suite.  Identity was confirmed.  All relevant records and images related to the planned course of therapy were reviewed.  Then, the patient was positioned in a stable reproducible clinical set-up for radiation therapy.  Wires were placed to delineate the clinical extent of breast tissue. A wire was placed on the scar as well.  CT images were obtained.  An isocenter was placed. Skin markings were placed.  The CT images were loaded into the planning software where the target and avoidance structures were contoured.  The radiation prescription was entered and confirmed. The patient was discharged in stable condition and tolerated simulation well.    TREATMENT PLANNING NOTE:  Treatment planning then occurred. I have requested : MLC's, isodose plan, basic dose calculation

## 2012-01-01 NOTE — Telephone Encounter (Signed)
Met with patient to discuss RO billing. Pt did have questions in regards to her [employer] out of pocket expenses, due to the [employer] physician is out of network [employer].  Dx: Cancer of lower-inner quadrant of female breast - Primary 174.3   Attending Rad: SW   Rad Tx: Daily .

## 2012-01-04 NOTE — Progress Notes (Signed)
Hematology and Oncology Follow Up Visit  Kristin Mills [id number] 08/21/1955 56 y.o. 01/04/2012 4:19 PM   DIAGNOSIS:   Encounter Diagnosis  Name Primary?  . 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  Interim History:  Pt has recovered well from surgery and has no complaints.she has been seen by radiation oncology and due to insurance issues , she might have to go elsewhere for radiation.  Medications: I have reviewed the patient's current medications.  Allergies: No Known Allergies  Past Medical History, Surgical history, Social history, and Family History were reviewed and updated.  Review of Systems: Constitutional:  Negative for fever, chills, night sweats, anorexia, weight loss, pain. Cardiovascular: no chest pain or dyspnea on exertion Respiratory: no cough, shortness of breath, or wheezing Neurological: negative Dermatological: negative ENT: negative Skin Gastrointestinal: negative Genito-Urinary: negative Hematological and Lymphatic: negative Breast: negative Musculoskeletal: negative Remaining ROS negative.  Physical Exam:  Blood pressure 132/79, pulse 85, temperature 98.1 F (36.7 C), resp. rate 20, height 5\' 8"  (1.727 m), weight 168 lb (76.204 kg).  ECOG: 0HEENT:  Sclerae anicteric, conjunctivae pink.  Oropharynx clear.  No mucositis or candidiasis.  Nodes:  No cervical, supraclavicular, or axillary lymphadenopathy palpated.  Breast Exam:  Right breast is s/p lumpectomy, her surgical scar is well healed.  Left breast is benign.  No masses, discharge, skin change, or nipple inversion..  Lungs:  Clear to auscultation bilaterally.  No crackles, rhonchi, or wheezes.  Heart:  Regular rate and rhythm.  Abdomen:  Soft, nontender.  Positive bowel sounds.  No organomegaly or masses palpated.  Musculoskeletal:  No focal spinal tenderness to palpation.  Extremities:  Benign.  No peripheral edema  or cyanosis.  Skin:  Benign.  Neuro:  Nonfocal.       Lab Results: Lab Results  Component Value Date   WBC 4.2 12/23/2011   HGB 13.4 12/23/2011   HCT 39.7 12/23/2011   MCV 89.2 12/23/2011   PLT 182 12/23/2011     Chemistry      Component Value Date/Time   NA 142 12/23/2011 1012   K 4.2 12/23/2011 1012   CL 106 12/23/2011 1012   CO2 26 12/23/2011 1012   BUN 18.0 12/23/2011 1012   CREATININE 0.9 12/23/2011 1012      Component Value Date/Time   CALCIUM 9.4 12/23/2011 1012   ALKPHOS 61 12/23/2011 1012   AST 20 12/23/2011 1012   ALT 19 12/23/2011 1012   BILITOT 1.07 12/23/2011 1012       Radiological Studies:  Nm Sentinel Node Inj-no Rpt (breast)  12/09/2011  CLINICAL DATA: right breast cancer   Sulfur colloid was injected intradermally by the nuclear medicine  technologist for breast cancer sentinel node localization.     Mm Breast Surgical Specimen  12/09/2011  Recent diagnosis of DCIS, *RADIOLOGY REPORT*  Clinical Data:  Stereotactic core needle biopsy demonstrated DCIS, LCIS and a small focus of invasive lobular carcinoma in the right breast.  RIGHT BREAST NEEDLE LOCALIZATION WITH MAMMOGRAPHIC GUIDANCE AND SPECIMEN RADIOGRAPH  Patient presents for needle localization prior to surgical excision. The patient and I discussed the procedure of needle localization including benefits and alternatives. We discussed the high likelihood of a successful procedure. We discussed the risks of the procedure, including infection, bleeding, tissue injury, and further surgery. Informed written consent was given.  Using mammographic guidance, sterile technique, 2% lidocaine, and a 7 cm modified Kopans needle,  the biopsy site in the central portion of the right breast was localized using a caudocranial approach. Films were labeled and sent with the patient to surgery.  She tolerated the procedure well.  Specimen radiograph was performed at [facility] operating room and confirms the clip and wire to be  present in the tissue sample.  The specimen is marked for pathology.  IMPRESSION: Needle localization right breast.  No apparent complications.   Original Report Authenticated By: Cain Saupe, M.D.    Mm Breast Wire Localization Right  12/09/2011  Recent diagnosis of DCIS, *RADIOLOGY REPORT*  Clinical Data:  Stereotactic core needle biopsy demonstrated DCIS, LCIS and a small focus of invasive lobular carcinoma in the right breast.  RIGHT BREAST NEEDLE LOCALIZATION WITH MAMMOGRAPHIC GUIDANCE AND SPECIMEN RADIOGRAPH  Patient presents for needle localization prior to surgical excision. The patient and I discussed the procedure of needle localization including benefits and alternatives. We discussed the high likelihood of a successful procedure. We discussed the risks of the procedure, including infection, bleeding, tissue injury, and further surgery. Informed written consent was given.  Using mammographic guidance, sterile technique, 2% lidocaine, and a 7 cm modified Kopans needle, the biopsy site in the central portion of the right breast was localized using a caudocranial approach. Films were labeled and sent with the patient to surgery.  She tolerated the procedure well.  Specimen radiograph was performed at [facility] operating room and confirms the clip and wire to be present in the tissue sample.  The specimen is marked for pathology.  IMPRESSION: Needle localization right breast.  No apparent complications.   Original Report Authenticated By: Cain Saupe, M.D.      IMPRESSIONS AND PLAN: A 56 y.o. female with   History of ER + , DCIS s/p recent lumpectomy. We discussed enrollment in B43 which she is considering. We will ask the research nurses to discuss this with her.. I will plan to See her after radiation to discuss tamoxifen therapy with her. Spent more than half the time coordinating care, as well as discussion of BMI and its implications.      [name] 12/15/20134:19  PM Cell [id number]

## 2012-01-06 ENCOUNTER — Encounter (HOSPITAL_COMMUNITY): Payer: Self-pay | Admitting: Dietician

## 2012-01-06 ENCOUNTER — Encounter: Payer: Self-pay | Admitting: Dietician

## 2012-01-06 NOTE — Progress Notes (Signed)
Breast Cancer Nutrition Class Attendance Note  Date: 01/06/2012  Pt attended [facility]'s Breast Cancer Nutrition Class, "Food For Your Fight". Pt was educated on basic cancer nutrition principles, including plant based diet and principles from AICR  ([employer]) about latest nutrition findings and recommendations. Questions answered. Handouts and recipes provided.   Hridhaan Yohn A. Kayan, RD, LDN Pager: [phone] 

## 2012-01-06 NOTE — Progress Notes (Signed)
Breast Cancer Nutrition Class Attendance Note  Date: 01/06/2012  [facility] Breast Cancer Nutrition [facility], "Food For Your Fight". Pt was educated on basic cancer nutrition principles, including plant based diet and principles from AICR  ([employer]) about latest nutrition findings and recommendations. Questions answered. Handouts and recipes provided.   Everard Interrante A. Kayan, RD, LDN Pager: [phone] 

## 2012-01-08 ENCOUNTER — Encounter: Payer: Self-pay | Admitting: Radiation Oncology

## 2012-01-08 ENCOUNTER — Ambulatory Visit
Admission: RE | Admit: 2012-01-08 | Discharge: 2012-01-08 | Disposition: A | Discharge status: Home / Self Care | Payer: 59 | Source: Ambulatory Visit | Attending: Radiation Oncology | Admitting: Radiation Oncology

## 2012-01-12 ENCOUNTER — Ambulatory Visit
Admission: RE | Admit: 2012-01-12 | Discharge: 2012-01-12 | Disposition: A | Discharge status: Home / Self Care | Payer: 59 | Source: Ambulatory Visit | Attending: Radiation Oncology | Admitting: Radiation Oncology

## 2012-01-13 ENCOUNTER — Ambulatory Visit
Admission: RE | Admit: 2012-01-13 | Discharge: 2012-01-13 | Disposition: A | Discharge status: Home / Self Care | Payer: 59 | Source: Ambulatory Visit | Attending: Radiation Oncology | Admitting: Radiation Oncology

## 2012-01-15 ENCOUNTER — Ambulatory Visit
Admission: RE | Admit: 2012-01-15 | Discharge: 2012-01-15 | Disposition: A | Discharge status: Home / Self Care | Payer: 59 | Source: Ambulatory Visit | Attending: Radiation Oncology | Admitting: Radiation Oncology

## 2012-01-16 ENCOUNTER — Ambulatory Visit
Admission: RE | Admit: 2012-01-16 | Discharge: 2012-01-16 | Disposition: A | Discharge status: Home / Self Care | Payer: 59 | Source: Ambulatory Visit | Attending: Radiation Oncology | Admitting: Radiation Oncology

## 2012-01-16 ENCOUNTER — Encounter: Payer: Self-pay | Admitting: Radiation Oncology

## 2012-01-16 VITALS — BP 113/70 | HR 77 | Resp 18 | Wt 170.0 lb

## 2012-01-16 DIAGNOSIS — C50319 Malignant neoplasm of lower-inner quadrant of unspecified female breast: Secondary | ICD-10-CM

## 2012-01-16 MED ORDER — ALRA NON-METALLIC DEODORANT (RAD-ONC)
1.0000 "application " | Freq: Once | TOPICAL | Status: AC
  Administered 2012-01-16: 1 via TOPICAL

## 2012-01-16 MED ORDER — RADIAPLEXRX EX GEL
Freq: Once | CUTANEOUS | Status: AC
  Administered 2012-01-16: 13:00:00 via TOPICAL

## 2012-01-16 NOTE — Progress Notes (Signed)
[facility]    Radiation Oncology [address] [place]     Maryln Gottron, M.D. [place], [place] [address]               Billie Lade, M.D., Ph.D. Phone: [phone]      Molli Hazard A. Kathrynn Running, M.D. Fax: [phone]      Radene Gunning, M.D., Ph.D.         Lurline Hare, M.D.         Grayland Jack, M.D Weekly Treatment Management Note  Name: Kristin Mills     MRN: [id number]        CSN: [id number] Date: 01/16/2012      DOB: 1955/05/26  CC: [name], MD         Kim    Status: Outpatient  Diagnosis: The encounter diagnosis was Cancer of lower-inner quadrant of female breast.  Current Dose: 10.68 Gy  Current Fraction: 4/16  Planned Dose: 42.72 Gy  Narrative: Kristin Mills was seen today for weekly treatment management. The chart was checked and port films  were reviewed. She is tolerating her radiation treatments well at this time without any side effects.  Review of patient's allergies indicates no known allergies.  Current Outpatient Prescriptions  Medication Sig Dispense Refill  . aspirin EC 81 MG tablet Take 81 mg by mouth daily.      . Cyanocobalamin 5000 MCG SUBL Place 5,000 mcg under the tongue daily.      . Loratadine (CLARITIN REDITABS) 5 MG TBDP Take 5 mg by mouth daily as needed. For allergies      . Omega-3 Fatty Acids (FISH OIL TRIPLE STRENGTH) 1400 MG CAPS Take 1,400 mg by mouth daily.      . simvastatin (ZOCOR) 20 MG tablet Take 20 mg by mouth daily.       Labs:  Lab Results  Component Value Date   WBC 4.2 12/23/2011   HGB 13.4 12/23/2011   HCT 39.7 12/23/2011   MCV 89.2 12/23/2011   PLT 182 12/23/2011   Lab Results  Component Value Date   CREATININE 0.9 12/23/2011   BUN 18.0 12/23/2011   NA 142 12/23/2011   K 4.2 12/23/2011   CL 106 12/23/2011   CO2 26 12/23/2011   Lab Results  Component Value Date   ALT 19 12/23/2011   AST 20 12/23/2011   BILITOT 1.07 12/23/2011    Physical Examination:  weight is 170 lb (77.111 kg). Her blood pressure  is 113/70 and her pulse is 77. Her respiration is 18.    Wt Readings from Last 3 Encounters:  01/16/12 170 lb (77.111 kg)  12/31/11 168 lb 11.2 oz (76.522 kg)  12/24/11 165 lb (74.844 kg)    The right breast area shows no appreciable skin reaction. The lumpectomy scar is well-healed. Lungs - Normal respiratory effort, chest expands symmetrically. Lungs are clear to auscultation, no crackles or wheezes.  Heart has regular rhythm and rate  Abdomen is soft and non tender with normal bowel sounds  Assessment:  Patient tolerating treatments well  Plan: Continue treatment per original radiation prescription

## 2012-01-16 NOTE — Progress Notes (Signed)
Patient presents to the clinic today for post sim education. Oriented patient to staff and routine of the clinic. Educated patient on potential side effects and management such as fatigue and skin changes. Provided patient with Radiaplex and Alra then, educated upon use. Provided patient with educational handout on RADIATION THERAPY TO THE BREAST. All questions answered. Provided patient with this writer's business card and encouraged to call with needs. Patient verbalized understanding of all reviewed.

## 2012-01-16 NOTE — Progress Notes (Signed)
Patient presents to the clinic today unaccompanied for PUT with Dr. Roselind Messier. Patient alert and oriented to person, place, and time. No distress noted. Steady gait noted. Pleasant affect noted. Patient denies pain at this time. Patient denies skin changes or fatigue. Reported all findings to Dr. Roselind Messier.

## 2012-01-19 ENCOUNTER — Ambulatory Visit
Admission: RE | Admit: 2012-01-19 | Discharge: 2012-01-19 | Disposition: A | Discharge status: Home / Self Care | Payer: 59 | Source: Ambulatory Visit | Attending: Radiation Oncology | Admitting: Radiation Oncology

## 2012-01-20 ENCOUNTER — Ambulatory Visit
Admission: RE | Admit: 2012-01-20 | Discharge: 2012-01-20 | Disposition: A | Discharge status: Home / Self Care | Payer: 59 | Source: Ambulatory Visit | Attending: Radiation Oncology | Admitting: Radiation Oncology

## 2012-01-20 ENCOUNTER — Encounter: Payer: Self-pay | Admitting: Radiation Oncology

## 2012-01-20 VITALS — BP 111/66 | HR 72 | Resp 16 | Wt 171.7 lb

## 2012-01-20 DIAGNOSIS — C50319 Malignant neoplasm of lower-inner quadrant of unspecified female breast: Secondary | ICD-10-CM

## 2012-01-20 NOTE — Progress Notes (Signed)
Patient presents to the clinic today unaccompanied for PUT with Dr. Michell Heinrich. Patient alert and oriented to person, place, and time. No distress noted. Steady gait noted. Pleasant affect noted. Patient denies pain at this time. Patient reports only faint pink hyperpigmentation of right/treated breast. Patient reports using radiaplex bid as directed. Patient denies that energy level has been affected. Reported all findings to Dr. Michell Heinrich.

## 2012-01-20 NOTE — Progress Notes (Signed)
Weekly Management Note Current Dose: 16.02  Gy  Projected Dose: 42.72 Gy   Narrative:  The patient presents for routine under treatment assessment.  CBCT/MVCT images/Port film x-rays were reviewed.  The chart was checked. Doing well. Questions about tamoxifen.   Physical Findings: Weight: 171 lb 11.2 oz (77.883 kg). Unchanged/slighly pink  Impression:  The patient is tolerating radiation.  Plan:  Continue treatment as planned. Continue radiaplex. Discussed pros and cons of tamoxifen. Encouraged her to try.

## 2012-01-22 ENCOUNTER — Ambulatory Visit
Admission: RE | Admit: 2012-01-22 | Discharge: 2012-01-22 | Disposition: A | Discharge status: Home / Self Care | Payer: 59 | Source: Ambulatory Visit | Attending: Radiation Oncology | Admitting: Radiation Oncology

## 2012-01-23 ENCOUNTER — Ambulatory Visit
Admission: RE | Admit: 2012-01-23 | Discharge: 2012-01-23 | Disposition: A | Discharge status: Home / Self Care | Payer: 59 | Source: Ambulatory Visit | Attending: Radiation Oncology | Admitting: Radiation Oncology

## 2012-01-26 ENCOUNTER — Ambulatory Visit
Admission: RE | Admit: 2012-01-26 | Discharge: 2012-01-26 | Disposition: A | Discharge status: Home / Self Care | Payer: 59 | Source: Ambulatory Visit | Attending: Radiation Oncology | Admitting: Radiation Oncology

## 2012-01-27 ENCOUNTER — Ambulatory Visit
Admission: RE | Admit: 2012-01-27 | Discharge: 2012-01-27 | Disposition: A | Discharge status: Home / Self Care | Payer: 59 | Source: Ambulatory Visit | Attending: Radiation Oncology | Admitting: Radiation Oncology

## 2012-01-27 ENCOUNTER — Encounter: Payer: Self-pay | Admitting: Radiation Oncology

## 2012-01-27 VITALS — BP 103/65 | HR 78 | Resp 16 | Wt 169.2 lb

## 2012-01-27 DIAGNOSIS — C50319 Malignant neoplasm of lower-inner quadrant of unspecified female breast: Secondary | ICD-10-CM

## 2012-01-27 NOTE — Progress Notes (Signed)
Weekly Management Note Current Dose: 26.7  Gy  Projected Dose: 52.72 Gy   Narrative:  The patient presents for routine under treatment assessment.  CBCT/MVCT images/Port film x-rays were reviewed.  The chart was checked. Doing well. No side effects. Using radiaplex.  Physical Findings: Weight: 169 lb 3.2 oz (76.749 kg). Slightly pink right breast. Better than inframammary fold.  Impression:  The patient is tolerating radiation.  Plan:  Continue treatment as planned. Continue biafene.

## 2012-01-27 NOTE — Progress Notes (Signed)
Only faint hyperpigmentation of right/treated breast noted. Reports using radiaplex bid as directed. Patient denies fatigue. Patient reports that she is going to the gym daily to walk and exercise. Patient has no complaints at this time.

## 2012-01-28 ENCOUNTER — Ambulatory Visit
Admission: RE | Admit: 2012-01-28 | Discharge: 2012-01-28 | Disposition: A | Discharge status: Home / Self Care | Payer: 59 | Source: Ambulatory Visit | Attending: Radiation Oncology | Admitting: Radiation Oncology

## 2012-01-29 ENCOUNTER — Ambulatory Visit
Admission: RE | Admit: 2012-01-29 | Discharge: 2012-01-29 | Disposition: A | Discharge status: Home / Self Care | Payer: 59 | Source: Ambulatory Visit | Attending: Radiation Oncology | Admitting: Radiation Oncology

## 2012-01-30 ENCOUNTER — Ambulatory Visit
Admission: RE | Admit: 2012-01-30 | Discharge: 2012-01-30 | Disposition: A | Discharge status: Home / Self Care | Payer: 59 | Source: Ambulatory Visit | Attending: Radiation Oncology | Admitting: Radiation Oncology

## 2012-02-02 ENCOUNTER — Ambulatory Visit
Admission: RE | Admit: 2012-02-02 | Discharge: 2012-02-02 | Disposition: A | Discharge status: Home / Self Care | Payer: 59 | Source: Ambulatory Visit | Attending: Radiation Oncology | Admitting: Radiation Oncology

## 2012-02-03 ENCOUNTER — Ambulatory Visit: Payer: 59

## 2012-02-03 ENCOUNTER — Ambulatory Visit
Admission: RE | Admit: 2012-02-03 | Discharge: 2012-02-03 | Disposition: A | Discharge status: Home / Self Care | Payer: 59 | Source: Ambulatory Visit | Attending: Radiation Oncology | Admitting: Radiation Oncology

## 2012-02-03 ENCOUNTER — Encounter: Payer: Self-pay | Admitting: Radiation Oncology

## 2012-02-03 VITALS — BP 106/71 | HR 76 | Resp 20 | Wt 168.7 lb

## 2012-02-03 DIAGNOSIS — C50319 Malignant neoplasm of lower-inner quadrant of unspecified female breast: Secondary | ICD-10-CM

## 2012-02-03 MED ORDER — BIAFINE EX EMUL
Freq: Two times a day (BID) | CUTANEOUS | Status: DC
Start: 2012-02-03 — End: 2012-02-04
  Administered 2012-02-03: 16:00:00 via TOPICAL

## 2012-02-03 NOTE — Addendum Note (Signed)
Encounter addended by: Glennie Hawk, RN on: 02/03/2012  4:42 PM<BR>     Documentation filed: Inpatient MAR, Orders

## 2012-02-03 NOTE — Progress Notes (Signed)
Weekly Management Note Current Dose: 40.05  Gy  Projected Dose: 42.72 Gy   Narrative:  The patient presents for routine under treatment assessment.  CBCT/MVCT images/Port film x-rays were reviewed.  The chart was checked. Doing well. Some itching. Concerned about boost plan and rescheduling appt with Donnie Coffin.   Physical Findings: Weight: 168 lb 11.2 oz (76.522 kg). Dry desquamation over medial chest.  Impression:  The patient is tolerating radiation.  Plan:  Continue treatment as planned. Switch to biafene. Add hydrocortisone. Will contact breast clinic for appt.

## 2012-02-03 NOTE — Progress Notes (Signed)
Pt denies pain, fatigue, loss of appetite. She is c/o itching of right breast tx area; gave her Biafine lotion. Pt verbalized proper use of lotion. Pt not tx today; may be tx after seeing dr if linac #2 up.

## 2012-02-04 ENCOUNTER — Ambulatory Visit
Admission: RE | Admit: 2012-02-04 | Discharge: 2012-02-04 | Disposition: A | Discharge status: Home / Self Care | Payer: 59 | Source: Ambulatory Visit | Attending: Radiation Oncology | Admitting: Radiation Oncology

## 2012-02-05 ENCOUNTER — Encounter: Payer: Self-pay | Admitting: Radiation Oncology

## 2012-02-05 ENCOUNTER — Ambulatory Visit
Admission: RE | Admit: 2012-02-05 | Discharge: 2012-02-05 | Disposition: A | Discharge status: Home / Self Care | Payer: 59 | Source: Ambulatory Visit | Attending: Radiation Oncology | Admitting: Radiation Oncology

## 2012-02-06 ENCOUNTER — Telehealth: Payer: Self-pay | Admitting: *Deleted

## 2012-02-06 ENCOUNTER — Ambulatory Visit
Admission: RE | Admit: 2012-02-06 | Discharge: 2012-02-06 | Disposition: A | Discharge status: Home / Self Care | Payer: 59 | Source: Ambulatory Visit | Attending: Radiation Oncology | Admitting: Radiation Oncology

## 2012-02-06 NOTE — Telephone Encounter (Signed)
Confirmed new appt with Dr. Welton Flakes on 03/15/12.

## 2012-02-09 ENCOUNTER — Ambulatory Visit
Admission: RE | Admit: 2012-02-09 | Discharge: 2012-02-09 | Disposition: A | Discharge status: Home / Self Care | Payer: 59 | Source: Ambulatory Visit | Attending: Radiation Oncology | Admitting: Radiation Oncology

## 2012-02-10 ENCOUNTER — Ambulatory Visit
Admission: RE | Admit: 2012-02-10 | Discharge: 2012-02-10 | Disposition: A | Discharge status: Home / Self Care | Payer: 59 | Source: Ambulatory Visit | Attending: Radiation Oncology | Admitting: Radiation Oncology

## 2012-02-10 ENCOUNTER — Encounter: Payer: Self-pay | Admitting: Radiation Oncology

## 2012-02-10 VITALS — BP 115/79 | HR 75 | Temp 98.1°F | Resp 20 | Wt 170.2 lb

## 2012-02-10 DIAGNOSIS — C50319 Malignant neoplasm of lower-inner quadrant of unspecified female breast: Secondary | ICD-10-CM

## 2012-02-10 NOTE — Progress Notes (Signed)
Patient here weekly rad tx right breast, 20/21 completed on boost now, dermatitis on top mid right chest/breast area, using biafine cream bid, c/o itching, gave 1 month f/u appt card 4:19 PM

## 2012-02-10 NOTE — Progress Notes (Signed)
Weekly Management Note Current Dose: 50.72  Gy  Projected Dose: 52.72 Gy   Narrative:  The patient presents for routine under treatment assessment.  CBCT/MVCT images/Port film x-rays were reviewed.  The chart was checked. Some irritation particularly around her nipple and medial chest. Ibuprofen and biafene are helping.   Physical Findings: Weight: 170 lb 3.2 oz (77.202 kg). Dermatitis medially. Pink breast  Impression:  The patient is tolerating radiation.  Plan:  Continue treatment as planned. Add aquaphor to breast. F/u in 1 month. Has f/u scheduled with med onc in February.

## 2012-02-11 ENCOUNTER — Encounter: Payer: Self-pay | Admitting: Radiation Oncology

## 2012-02-11 ENCOUNTER — Ambulatory Visit
Admission: RE | Admit: 2012-02-11 | Discharge: 2012-02-11 | Disposition: A | Discharge status: Home / Self Care | Payer: 59 | Source: Ambulatory Visit | Attending: Radiation Oncology | Admitting: Radiation Oncology

## 2012-02-12 ENCOUNTER — Encounter: Payer: Self-pay | Admitting: Oncology

## 2012-02-12 ENCOUNTER — Ambulatory Visit: Payer: 59

## 2012-02-12 NOTE — Progress Notes (Signed)
  Radiation Oncology         [phone] ________________________________  Name: Kristin Mills MRN: [id number]  Date: 02/11/2012  DOB: [birth year]  End of Treatment Note  Diagnosis:   DCIS of the right breast  Indication for treatment:  Curative       Radiation treatment dates:   01/12/2012-02/11/2012  Site/dose:    Right breast / 42.72Gray @ 1.8 Gray per fraction x 25 fractions Right breast boost / 10 Gray at [employer] per fraction x 5 fractions  Beams/energy:  Opposed Tangents / 6 MV photons En face / 6 MV electrons  Narrative: The patient tolerated radiation treatment relatively well.   She had the expected skin toxicity.  This was treated with radiaplex.  She was able to continue working during treatment.   Plan: The patient has completed radiation treatment. The patient will return to radiation oncology clinic for routine followup in one month. I advised them to call or return sooner if they have any questions or concerns related to their recovery or treatment.  ------------------------------------------------  Lurline Hare, MD

## 2012-02-12 NOTE — Progress Notes (Signed)
  Radiation Oncology         [phone] ________________________________  Name: Kristin Mills MRN: [id number]  Date: 02/05/2012  DOB: 08/02/1955  Simulation Verification Note  Status:  Outpatient  NARRATIVE: The patient was brought to the treatment unit and placed in the planned treatment position. The clinical setup was verified. Then port films were obtained and uploaded to the radiation oncology medical record software.  The treatment beams were carefully compared against the planned radiation fields. The position location and shape of the radiation fields was reviewed. The targeted volume of tissue appears appropriately covered by the radiation beams. Organs at risk appear to be excluded as planned.  Based on my personal review, I approved the simulation verification. The patient's treatment will proceed as planned.  ------------------------------------------------  Lurline Hare, MD

## 2012-02-12 NOTE — Progress Notes (Signed)
  Radiation Oncology         [phone] ________________________________  Name: Kristin Mills MRN: [id number]  Date: 01/08/2012  DOB: [birth year]  Simulation Verification Note  Status:  outpatient  NARRATIVE: The patient was brought to the treatment unit and placed in the planned treatment position. The clinical setup was verified. Then port films were obtained and uploaded to the radiation oncology medical record software.  The treatment beams were carefully compared against the planned radiation fields. The position location and shape of the radiation fields was reviewed. The targeted volume of tissue appears appropriately covered by the radiation beams. Organs at risk appear to be excluded as planned.  Based on my personal review, I approved the simulation verification. The patient's treatment will proceed as planned.  ------------------------------------------------  Lurline Hare, MD

## 2012-02-13 ENCOUNTER — Ambulatory Visit: Payer: 59

## 2012-02-15 ENCOUNTER — Other Ambulatory Visit: Payer: Self-pay | Admitting: *Deleted

## 2012-02-15 DIAGNOSIS — C50319 Malignant neoplasm of lower-inner quadrant of unspecified female breast: Secondary | ICD-10-CM

## 2012-02-17 ENCOUNTER — Ambulatory Visit: Payer: 59

## 2012-02-18 ENCOUNTER — Ambulatory Visit: Payer: 59

## 2012-02-19 ENCOUNTER — Ambulatory Visit: Payer: 59

## 2012-02-20 ENCOUNTER — Ambulatory Visit: Payer: 59

## 2012-02-23 ENCOUNTER — Ambulatory Visit: Payer: 59

## 2012-02-24 ENCOUNTER — Ambulatory Visit: Payer: 59

## 2012-02-25 ENCOUNTER — Ambulatory Visit: Payer: 59

## 2012-02-26 ENCOUNTER — Ambulatory Visit: Payer: 59

## 2012-02-27 ENCOUNTER — Ambulatory Visit: Payer: 59

## 2012-03-06 ENCOUNTER — Other Ambulatory Visit: Payer: Self-pay

## 2012-03-10 ENCOUNTER — Ambulatory Visit: Payer: 59 | Admitting: Oncology

## 2012-03-10 ENCOUNTER — Other Ambulatory Visit: Payer: 59 | Admitting: Lab

## 2012-03-12 ENCOUNTER — Encounter: Payer: Self-pay | Admitting: Radiation Oncology

## 2012-03-12 ENCOUNTER — Ambulatory Visit
Admission: RE | Admit: 2012-03-12 | Discharge: 2012-03-12 | Disposition: A | Discharge status: Home / Self Care | Payer: 59 | Source: Ambulatory Visit | Attending: Radiation Oncology | Admitting: Radiation Oncology

## 2012-03-12 VITALS — BP 115/74 | HR 71 | Temp 97.6°F | Resp 20 | Wt 172.8 lb

## 2012-03-12 DIAGNOSIS — C50919 Malignant neoplasm of unspecified site of unspecified female breast: Secondary | ICD-10-CM

## 2012-03-12 DIAGNOSIS — Z923 Personal history of irradiation: Secondary | ICD-10-CM | POA: Insufficient documentation

## 2012-03-12 HISTORY — DX: Personal history of irradiation: Z92.3

## 2012-03-12 NOTE — Progress Notes (Signed)
   Department of Radiation Oncology  Phone:  [phone] Fax:        [phone]   Name: Kristin Mills MRN: [id number]  DOB: 1955-09-26  Date: 03/12/2012  Follow Up Visit Note  Diagnosis: DCIS of the right breast  Summary and Interval since last radiation: One month  Interval History: Kristin Mills presents today for routine followup.  Her skin is healed up well. She is meeting with Dr. Welton Flakes on Monday. She is pleased with her cosmetic result. She and her husband are headed to [place] in a couple weeks. He does seem to be working out with at but she has not received the bill for her treatments from the hospital.  Allergies: No Known Allergies  Medications:  Current Outpatient Prescriptions  Medication Sig Dispense Refill  . simvastatin (ZOCOR) 20 MG tablet Take 20 mg by mouth daily.      Marland Kitchen aspirin EC 81 MG tablet Take 81 mg by mouth daily.      . Cyanocobalamin 5000 MCG SUBL Place 5,000 mcg under the tongue daily.      . Loratadine (CLARITIN REDITABS) 5 MG TBDP Take 5 mg by mouth daily as needed. For allergies      . Omega-3 Fatty Acids (FISH OIL TRIPLE STRENGTH) 1400 MG CAPS Take 1,400 mg by mouth daily.       No current facility-administered medications for this encounter.    Physical Exam:  Filed Vitals:   03/12/12 1314  BP: 115/74  Pulse: 71  Temp: 97.6 F (36.4 C)  Resp: 20   she is pleasant female in no distress sitting comfortably on the examining room table. She has some very slight hyperpigmentation of the right breast.  IMPRESSION: Kristin Mills is a 57 y.o. female status post radiation as part of breast conservation with resolving acute effects of treatment  PLAN:  Kristin Mills looks great. Her skin is healing up well. We discussed sun protection of the treated area. She will meet with one of our billing staff to discuss her bill from the hospital. She is regular scheduled followup with medical oncology. I have not scheduled followup with her with me. I be happy to see her back on an  as-needed basis.    Lurline Hare, MD

## 2012-03-12 NOTE — Progress Notes (Signed)
Pt denies pain, fatigue, loss of appetite. She is not on Tamoxifen; has appt w/Dr Welton Flakes 03/15/12 and will discuss then.

## 2012-03-15 ENCOUNTER — Encounter: Payer: Self-pay | Admitting: Oncology

## 2012-03-15 ENCOUNTER — Ambulatory Visit (HOSPITAL_BASED_OUTPATIENT_CLINIC_OR_DEPARTMENT_OTHER): Payer: 59 | Admitting: Lab

## 2012-03-15 ENCOUNTER — Telehealth: Payer: Self-pay | Admitting: Oncology

## 2012-03-15 ENCOUNTER — Other Ambulatory Visit: Payer: Self-pay | Admitting: Emergency Medicine

## 2012-03-15 ENCOUNTER — Ambulatory Visit (HOSPITAL_BASED_OUTPATIENT_CLINIC_OR_DEPARTMENT_OTHER): Payer: 59 | Admitting: Oncology

## 2012-03-15 VITALS — BP 116/75 | HR 80 | Temp 98.0°F | Resp 20 | Ht 67.0 in | Wt 170.0 lb

## 2012-03-15 DIAGNOSIS — M899 Disorder of bone, unspecified: Secondary | ICD-10-CM

## 2012-03-15 DIAGNOSIS — C50311 Malignant neoplasm of lower-inner quadrant of right female breast: Secondary | ICD-10-CM

## 2012-03-15 DIAGNOSIS — D0590 Unspecified type of carcinoma in situ of unspecified breast: Secondary | ICD-10-CM

## 2012-03-15 DIAGNOSIS — C50319 Malignant neoplasm of lower-inner quadrant of unspecified female breast: Secondary | ICD-10-CM

## 2012-03-15 DIAGNOSIS — Z170 Estrogen receptor positive status [ER+]: Secondary | ICD-10-CM

## 2012-03-15 LAB — CBC WITH DIFFERENTIAL/PLATELET
BASO%: 0.9 % (ref 0.0–2.0)
Basophils Absolute: 0 10*3/uL (ref 0.0–0.1)
EOS%: 4.2 % (ref 0.0–7.0)
Eosinophils Absolute: 0.1 10*3/uL (ref 0.0–0.5)
HCT: 38.9 % (ref 34.8–46.6)
HGB: 13.2 g/dL (ref 11.6–15.9)
LYMPH%: 24.9 % (ref 14.0–49.7)
MCH: 29.6 pg (ref 25.1–34.0)
MCHC: 34 g/dL (ref 31.5–36.0)
MCV: 87.2 fL (ref 79.5–101.0)
MONO#: 0.3 10*3/uL (ref 0.1–0.9)
MONO%: 9.2 % (ref 0.0–14.0)
NEUT#: 1.7 10*3/uL (ref 1.5–6.5)
NEUT%: 60.8 % (ref 38.4–76.8)
Platelets: 164 10*3/uL (ref 145–400)
RBC: 4.46 10*6/uL (ref 3.70–5.45)
RDW: 14.4 % (ref 11.2–14.5)
WBC: 2.7 10*3/uL — ABNORMAL LOW (ref 3.9–10.3)
lymph#: 0.7 10*3/uL — ABNORMAL LOW (ref 0.9–3.3)

## 2012-03-15 LAB — COMPREHENSIVE METABOLIC PANEL (CC13)
ALT: 30 U/L (ref 0–55)
AST: 28 U/L (ref 5–34)
Albumin: 4 g/dL (ref 3.5–5.0)
Alkaline Phosphatase: 66 U/L (ref 40–150)
BUN: 17.6 mg/dL (ref 7.0–26.0)
CO2: 27 mEq/L (ref 22–29)
Calcium: 9.3 mg/dL (ref 8.4–10.4)
Chloride: 105 mEq/L (ref 98–107)
Creatinine: 0.9 mg/dL (ref 0.6–1.1)
Glucose: 103 mg/dl — ABNORMAL HIGH (ref 70–99)
Potassium: 3.9 mEq/L (ref 3.5–5.1)
Sodium: 141 mEq/L (ref 136–145)
Total Bilirubin: 1.02 mg/dL (ref 0.20–1.20)
Total Protein: 7.3 g/dL (ref 6.4–8.3)

## 2012-03-15 MED ORDER — EXEMESTANE 25 MG PO TABS: 25.0000 mg | ORAL_TABLET | Freq: Every day | ORAL | Status: DC

## 2012-03-15 NOTE — Telephone Encounter (Signed)
, °

## 2012-03-15 NOTE — Patient Instructions (Addendum)
Proceed with aromasin 25 mg daily  We will see you back in 4 months  Exemestane tablets What is this medicine? EXEMESTANE (ex e MES tane) blocks the production of the hormone estrogen. Some types of breast cancer depend on estrogen to grow, and this medicine can stop tumor growth by blocking estrogen production. This medicine is for the treatment of breast cancer in postmenopausal women only. This medicine may be used for other purposes; ask your health care provider or pharmacist if you have questions. What should I tell my health care provider before I take this medicine? They need to know if you have any of these conditions: -an unusual or allergic reaction to exemestane, other medicines, foods, dyes, or preservatives -pregnant or trying to get pregnant -breast-feeding How should I use this medicine? Take this medicine by mouth with a glass of water. Follow the directions on the prescription label. Take your doses at regular intervals after a meal. Do not take your medicine more often than directed. Do not stop taking except on the advice of your doctor or health care professional. Contact your pediatrician regarding the use of this medicine in children. Special care may be needed. Overdosage: If you think you have taken too much of this medicine contact a poison control center or emergency room at once. NOTE: This medicine is only for you. Do not share this medicine with others. What if I miss a dose? If you miss a dose, take the next dose as usual. Do not try to make up the missed dose. Do not take double or extra doses. What may interact with this medicine? Do not take this medicine with any of the following medications: -female hormones, like estrogens and birth control pills This medicine may also interact with the following medications: -androstenedione -phenytoin -rifabutin, rifampin, or rifapentine -St. John's Wort This list may not describe all possible interactions. Give your  health care provider a list of all the medicines, herbs, non-prescription drugs, or dietary supplements you use. Also tell them if you smoke, drink alcohol, or use illegal drugs. Some items may interact with your medicine. What should I watch for while using this medicine? Visit your doctor or health care professional for regular checks on your progress. If you experience hot flashes or sweating while taking this medicine, avoid alcohol, smoking and drinks with caffeine. This may help to decrease these side effects. What side effects may I notice from receiving this medicine? Side effects that you should report to your doctor or health care professional as soon as possible: -any new or unusual symptoms -changes in vision -fever -leg or arm swelling -pain in bones, joints, or muscles -pain in hips, back, ribs, arms, shoulders, or legs Side effects that usually do not require medical attention (report to your doctor or health care professional if they continue or are bothersome): -difficulty sleeping -headache -hot flashes -sweating -unusually weak or tired This list may not describe all possible side effects. Call your doctor for medical advice about side effects. You may report side effects to FDA at 1-800-FDA-1088. Where should I keep my medicine? Keep out of the reach of children. Store at room temperature between 15 and 30 degrees C (59 and 86 degrees F). Throw away any unused medicine after the expiration date. NOTE: This sheet is a summary. It may not cover all possible information. If you have questions about this medicine, talk to your doctor, pharmacist, or health care provider.  2012, Elsevier/Gold Standard. (05/11/2007 11:48:29 AM)

## 2012-03-15 NOTE — Progress Notes (Signed)
OFFICE PROGRESS NOTE  CC  Kristin Grippe, MD [address] [place] [address] Dr. Lurline Mills Dr. Ovidio Mills Dr. 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  PRIOR THERAPY:  #1 patient was originally seen in the multidisciplinary breast clinic after she had a screening mammogram that showed calcifications. She went on to have a biopsy that showed DCIS with lobular U. Plasia. The tumor was ER +100% PR +86%.  #2 she had a lumpectomy with sentinel lymph node biopsy on 12/09/2011 with the final pathology revealing lobular neoplasia (atypical hyperplasia and in situ carcinoma). 4 sentinel nodes were negative for metastatic disease. Tumor was ER positive PR positive final staging was P. Tis PN 0. Clinical stage 0.  #3 patient was enrolled on NSABP B. 43 and she was tested for the HER-2/neu receptor which was negative.  #4 patient has gone on to receive radiation therapy to the right breast which she completed on 02/12/2012.  #5 patient does have a history of lupus anticoagulant and had a full hypoechoic panel performed today results of which are pending.  #6 patient will now begin chemoprevention with Aromasin 25 mg daily. Rationale for this was discussed with the patient literature was given to her. A total of 5 years of therapy is planned. Patient will begin this in late March 2014 since she is planning on going on a trip in mid March.  CURRENT THERAPY:to begin Aromasin 25 mg daily in March 2014  INTERVAL HISTORY: Kristin Mills 57 y.o. female returns for followup visit after completion of radiation therapy. Overall she did well without any problems. Today she feels well her skin has healed very nicely. She denies any fevers chills night sweats headaches shortness of breath chest pains or palpitations she has no myalgias and arthralgias. Patient does have history of osteopenia.  MEDICAL  HISTORY: Past Medical History  Diagnosis Date  . Headache     sinus  . Breast cancer 12/09/11    DCIS right breast, ER+  . Lupus anticoagulant positive   . Hypercholesterolemia   . Glaucoma, anatomical narrow angle     treated with laser surgery at [facility]  . Skin cancer     basal cell carcinoma; left side of mouth; removed bu Dr. Terri Mills in office  . History of radiation therapy 01/12/12 -02/11/12    right breast    ALLERGIES:  has No Known Allergies.  MEDICATIONS:  Current Outpatient Prescriptions  Medication Sig Dispense Refill  . aspirin EC 81 MG tablet Take 81 mg by mouth daily.      . Loratadine (CLARITIN REDITABS) 5 MG TBDP Take 5 mg by mouth daily as needed. For allergies      . simvastatin (ZOCOR) 20 MG tablet Take 20 mg by mouth daily.      . Cyanocobalamin 5000 MCG SUBL Place 5,000 mcg under the tongue daily.      . Omega-3 Fatty Acids (FISH OIL TRIPLE STRENGTH) 1400 MG CAPS Take 1,400 mg by mouth daily.       No current facility-administered medications for this visit.    SURGICAL HISTORY:  Past Surgical History  Procedure Laterality Date  . Peripheral iridotomy  2009    bilateral  . Dilation and curettage of uterus    . Ganglion cyst excision      right  . Breast lumpectomy with needle localization and axillary sentinel lymph node bx  12/09/2011    Procedure:  BREAST LUMPECTOMY WITH NEEDLE LOCALIZATION AND AXILLARY SENTINEL LYMPH NODE BX;  Surgeon: Kristin Cocking, MD;  Location: [facility] OR;  Service: General;  Laterality: Right;  . Abdominal hysterectomy  1999    partial; left ovaries    REVIEW OF SYSTEMS:  Pertinent items are noted in HPI.   HEALTH MAINTENANCE:  PHYSICAL EXAMINATION: Blood pressure 116/75, pulse 80, temperature 98 F (36.7 C), temperature source Oral, resp. rate 20, height 5\' 7"  (1.702 m), weight 170 lb (77.111 kg). Body mass index is 26.62 kg/(m^2). ECOG PERFORMANCE STATUS: 0 - Asymptomatic   General appearance: alert, cooperative and  appears stated age Lymph nodes: Cervical, supraclavicular, and axillary nodes normal. Resp: clear to auscultation bilaterally Back: symmetric, no curvature. ROM normal. No CVA tenderness. Cardio: regular rate and rhythm GI: soft, non-tender; bowel sounds normal; no masses,  no organomegaly Extremities: extremities normal, atraumatic, no cyanosis or edema Neurologic: Grossly normal Bilateral breast examination: Right breast well-healed surgical scar no masses or nipple discharge minimal skin changes. Left breast no masses or nipple discharge.  LABORATORY DATA: Lab Results  Component Value Date   WBC 2.7* 03/15/2012   HGB 13.2 03/15/2012   HCT 38.9 03/15/2012   MCV 87.2 03/15/2012   PLT 164 03/15/2012      Chemistry      Component Value Date/Time   NA 141 03/15/2012 1443   K 3.9 03/15/2012 1443   CL 105 03/15/2012 1443   CO2 27 03/15/2012 1443   BUN 17.6 03/15/2012 1443   CREATININE 0.9 03/15/2012 1443      Component Value Date/Time   CALCIUM 9.3 03/15/2012 1443   ALKPHOS 66 03/15/2012 1443   AST 28 03/15/2012 1443   ALT 30 03/15/2012 1443   BILITOT 1.02 03/15/2012 1443       RADIOGRAPHIC STUDIES:  No results found.  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 She is status post lumpectomy with sentinel lymph node biopsy in November 2013. Final pathology revealed DCIS that was ER positive PR positive HER-2/neu negative. Her HER-2 testing was on NSABP B. 43 clinical study.  #2 patient is status post radiation therapy completed in January 2014. Overall she tolerated it well.  #3 she will now proceed with adjuvant antiestrogen therapy consisting of Aromasin 25 mg daily. We will not do tamoxifen since she has a history of having a lupus anticoagulant. However a full hyperechoic panel has been drawn today I will also followup on those results.   PLAN:   #1 patient will proceed with Aromasin 25 mg daily towards  the end of March. She does not want to start it before that since she is going on a trip to [place]  #2 I will plan on seeing her back in about 4 months time.   All questions were answered. The patient knows to call the clinic with any problems, questions or concerns. We can certainly see the patient much sooner if necessary.  I spent 40 minutes counseling the patient face to face. The total time spent in the appointment was 40 minutes.    Drue Second, MD Medical/Oncology [facility] [phone] (beeper) [phone] (Office)  03/15/2012, 3:57 PM

## 2012-03-16 LAB — LUPUS ANTICOAGULANT PANEL
DRVVT 1:1 Mix: 44.6 secs — ABNORMAL HIGH (ref <42.9)
DRVVT: 55.4 secs — ABNORMAL HIGH (ref <42.9)
Drvvt confirmation: 1.66 Ratio — ABNORMAL HIGH (ref <1.11)
Lupus Anticoagulant: DETECTED — AB
PTT Lupus Anticoagulant: 50.1 secs — ABNORMAL HIGH (ref 28.0–43.0)
PTTLA 4:1 Mix: 51.4 secs — ABNORMAL HIGH (ref 28.0–43.0)
PTTLA Confirmation: 31.1 secs — ABNORMAL HIGH (ref <8.0)

## 2012-03-16 LAB — CARDIOLIPIN ANTIBODIES, IGG, IGM, IGA
Anticardiolipin IgA: 2 APL U/mL (ref <22)
Anticardiolipin IgG: 4 GPL U/mL (ref <23)
Anticardiolipin IgM: 6 MPL U/mL (ref <11)

## 2012-03-19 LAB — BETA-2 GLYCOPROTEIN ANTIBODIES
Beta-2 Glyco I IgG: 2 G Units (ref <20)
Beta-2-Glycoprotein I IgA: 7 A Units (ref <20)
Beta-2-Glycoprotein I IgM: 11 M Units (ref <20)

## 2012-03-20 LAB — HEXAGONAL PHOSPHOLIPID NEUTRALIZATION: Hex Phosph Neut Test: POSITIVE — AB

## 2012-04-08 ENCOUNTER — Telehealth: Payer: Self-pay | Admitting: *Deleted

## 2012-04-08 NOTE — Telephone Encounter (Signed)
Patient called with questions about aromasin.   Asked what is her prognosis with the stage zero, has had surgery and radiation? Would like to know how important it is for her to take this medicine? Is the osteoporosis reversible after the five year aromasin treatment? Dr. Nicholas Lose has her previous bone scan results.  Reportedly Bone scan to be scheduled in June.  Can Aromasin be started after bone scan in June?  Cost was an issue and this is resolved.  Expressed her being pre-disposed to osteoporosis due to mom having it and her osteopenia.  Trying to decide what's worse, The side effect of Aromasin or the osteoporosis.  Will notify providers.  Patient can be reached via cell number [phone].

## 2012-04-16 ENCOUNTER — Telehealth: Payer: Self-pay | Admitting: Medical Oncology

## 2012-04-26 NOTE — Telephone Encounter (Signed)
Error

## 2012-05-07 ENCOUNTER — Telehealth: Payer: Self-pay | Admitting: Medical Oncology

## 2012-05-07 NOTE — Telephone Encounter (Signed)
Called pt to f/u with pt's questions re aromasin and to sched appt to see Augustin Schooling, NP. Patient states she spoke with her PCP and feels that her questions have been answered. States she has been taking the Aromasin now for 3 weeks and has been doing well other then having some headache/nausea but she's not sure if that could be from seasonal allergies that she is having right now as well. Asked pt about sched an earlier appt with NP, pt states she will continue to take the medication and feels will have a better idea about medication and s/e after three months, just about the time her appt sched for 06/23 is. Does not wish to have earlier appt at this time. Encouraged patient to call office with any questions or concerns. Patient expressed thanks.

## 2012-06-16 ENCOUNTER — Telehealth: Payer: Self-pay | Admitting: Emergency Medicine

## 2012-06-16 NOTE — Telephone Encounter (Signed)
Patient called with concerns about symptoms she has been experiencing since starting the Aromasin. Patient states she had joint stiffness, fatigue, "cloudy headed", and intermittent numbness in her foot. Patient states she stopped taking the Aromasin 1 1/2 weeks ago; states all symptoms have resolved since stopping the med. Patient is scheduled to see Dr Welton Flakes on 6/23; she plans to discuss her options and prognosis then. Patient advised to stay off the Aromasin until she is seen by Dr Welton Flakes on 6/23 when a plan can me discussed. Patient verbalized understanding and encouraged to call with any further concerns or questions.

## 2012-06-24 ENCOUNTER — Other Ambulatory Visit: Payer: 59

## 2012-06-28 ENCOUNTER — Ambulatory Visit
Admission: RE | Admit: 2012-06-28 | Discharge: 2012-06-28 | Disposition: A | Discharge status: Home / Self Care | Payer: Managed Care, Other (non HMO) | Source: Ambulatory Visit | Attending: Oncology | Admitting: Oncology

## 2012-06-28 DIAGNOSIS — C50311 Malignant neoplasm of lower-inner quadrant of right female breast: Secondary | ICD-10-CM

## 2012-06-30 NOTE — Progress Notes (Signed)
Quick Note:  Please call patient: begin taking fosamax 35 mg q week ______

## 2012-07-12 ENCOUNTER — Telehealth: Payer: Self-pay | Admitting: Oncology

## 2012-07-12 ENCOUNTER — Encounter: Payer: Self-pay | Admitting: Oncology

## 2012-07-12 ENCOUNTER — Ambulatory Visit (HOSPITAL_BASED_OUTPATIENT_CLINIC_OR_DEPARTMENT_OTHER): Payer: Managed Care, Other (non HMO) | Admitting: Oncology

## 2012-07-12 ENCOUNTER — Other Ambulatory Visit (HOSPITAL_BASED_OUTPATIENT_CLINIC_OR_DEPARTMENT_OTHER): Payer: Managed Care, Other (non HMO) | Admitting: Lab

## 2012-07-12 VITALS — BP 111/73 | HR 88 | Temp 98.2°F | Resp 20 | Ht 67.0 in | Wt 170.5 lb

## 2012-07-12 DIAGNOSIS — C50311 Malignant neoplasm of lower-inner quadrant of right female breast: Secondary | ICD-10-CM

## 2012-07-12 DIAGNOSIS — C50319 Malignant neoplasm of lower-inner quadrant of unspecified female breast: Secondary | ICD-10-CM

## 2012-07-12 LAB — CBC WITH DIFFERENTIAL/PLATELET
BASO%: 0.6 % (ref 0.0–2.0)
Basophils Absolute: 0 10*3/uL (ref 0.0–0.1)
EOS%: 5 % (ref 0.0–7.0)
Eosinophils Absolute: 0.2 10*3/uL (ref 0.0–0.5)
HCT: 38.5 % (ref 34.8–46.6)
HGB: 13.1 g/dL (ref 11.6–15.9)
LYMPH%: 28.6 % (ref 14.0–49.7)
MCH: 29.4 pg (ref 25.1–34.0)
MCHC: 34 g/dL (ref 31.5–36.0)
MCV: 86.4 fL (ref 79.5–101.0)
MONO#: 0.3 10*3/uL (ref 0.1–0.9)
MONO%: 9.3 % (ref 0.0–14.0)
NEUT#: 2.1 10*3/uL (ref 1.5–6.5)
NEUT%: 56.5 % (ref 38.4–76.8)
Platelets: 182 10*3/uL (ref 145–400)
RBC: 4.46 10*6/uL (ref 3.70–5.45)
RDW: 14.8 % — ABNORMAL HIGH (ref 11.2–14.5)
WBC: 3.6 10*3/uL — ABNORMAL LOW (ref 3.9–10.3)
lymph#: 1 10*3/uL (ref 0.9–3.3)

## 2012-07-12 LAB — COMPREHENSIVE METABOLIC PANEL (CC13)
ALT: 17 U/L (ref 0–55)
AST: 18 U/L (ref 5–34)
Albumin: 3.8 g/dL (ref 3.5–5.0)
Alkaline Phosphatase: 60 U/L (ref 40–150)
BUN: 16.5 mg/dL (ref 7.0–26.0)
CO2: 28 mEq/L (ref 22–29)
Calcium: 10 mg/dL (ref 8.4–10.4)
Chloride: 106 mEq/L (ref 98–107)
Creatinine: 0.8 mg/dL (ref 0.6–1.1)
Glucose: 59 mg/dl — ABNORMAL LOW (ref 70–99)
Potassium: 3.9 mEq/L (ref 3.5–5.1)
Sodium: 144 mEq/L (ref 136–145)
Total Bilirubin: 0.97 mg/dL (ref 0.20–1.20)
Total Protein: 7.1 g/dL (ref 6.4–8.3)

## 2012-07-12 NOTE — Patient Instructions (Addendum)
We discussed your different options for breast cancer  We discussed lifestyle modification   You have committed to starting an exercise and healthy eating  I will see you back in 3 month

## 2012-07-12 NOTE — Progress Notes (Signed)
OFFICE PROGRESS NOTE  CC  [name], MD [address] [place] [address] Dr. Lurline Hare Dr. Ovidio Kin Dr. 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  PRIOR THERAPY:  #1 patient was originally seen in the multidisciplinary breast clinic after she had a screening mammogram that showed calcifications. She went on to have a biopsy that showed DCIS with lobular U. Plasia. The tumor was ER +100% PR +86%.  #2 she had a lumpectomy with sentinel lymph node biopsy on 12/09/2011 with the final pathology revealing lobular neoplasia (atypical hyperplasia and in situ carcinoma). 4 sentinel nodes were negative for metastatic disease. Tumor was ER positive PR positive final staging was P. Tis PN 0. Clinical stage 0.  #3 patient was enrolled on NSABP B. 43 and she was tested for the HER-2/neu receptor which was negative.  #4 patient has gone on to receive radiation therapy to the right breast which she completed on 02/12/2012.  #5 patient does have a history of lupus anticoagulant and had a full hypoechoic panel performed today results of which are pending.  #6 patient will now begin chemoprevention with Aromasin 25 mg daily. Rationale for this was discussed with the patient literature was given to her. A total of 5 years of therapy is planned. Patient will begin this in late March 2014 since she is planning on going on a trip in mid March. This is now discontinue do to intolerance  CURRENT THERAPY:observation  INTERVAL HISTORY: Rakhi Romagnoli 57 y.o. female returns for followup visit. She had been on Aromasin only for 3 weeks but developed debilitating side effects including memory loss weakness fatigue aches pains. Her quality of life suffered. She discontinued the Aromasin and her symptoms resolved. Today she feels overall well and has no nausea no vomiting no fevers chills night sweats headaches  no myalgias and arthralgias.remainder of the 10 point review of systems is negative. MEDICAL HISTORY: Past Medical History  Diagnosis Date  . Headache(784.0)     sinus  . Breast cancer 12/09/11    DCIS right breast, ER+  . Lupus anticoagulant positive   . Hypercholesterolemia   . Glaucoma, anatomical narrow angle     treated with laser surgery at [facility]  . Skin cancer     basal cell carcinoma; left side of mouth; removed bu Dr. Terri Piedra in office  . History of radiation therapy 01/12/12 -02/11/12    right breast    ALLERGIES:  has No Known Allergies.  MEDICATIONS:  Current Outpatient Prescriptions  Medication Sig Dispense Refill  . aspirin EC 81 MG tablet Take 81 mg by mouth daily.      . Loratadine (CLARITIN REDITABS) 5 MG TBDP Take 5 mg by mouth daily as needed. For allergies      . simvastatin (ZOCOR) 20 MG tablet Take 20 mg by mouth daily.      . Cyanocobalamin 5000 MCG SUBL Place 5,000 mcg under the tongue daily.      Marland Kitchen exemestane (AROMASIN) 25 MG tablet Take 1 tablet (25 mg total) by mouth daily after breakfast.  90 tablet  12  . Omega-3 Fatty Acids (FISH OIL TRIPLE STRENGTH) 1400 MG CAPS Take 1,400 mg by mouth daily.       No current facility-administered medications for this visit.    SURGICAL HISTORY:  Past Surgical History  Procedure Laterality Date  . Peripheral iridotomy  2009    bilateral  . Dilation  and curettage of uterus    . Ganglion cyst excision      right  . Breast lumpectomy with needle localization and axillary sentinel lymph node bx  12/09/2011    Procedure: BREAST LUMPECTOMY WITH NEEDLE LOCALIZATION AND AXILLARY SENTINEL LYMPH NODE BX;  Surgeon: Kandis Cocking, MD;  Location: [facility] OR;  Service: General;  Laterality: Right;  . Abdominal hysterectomy  1999    partial; left ovaries    REVIEW OF SYSTEMS:  Pertinent items are noted in HPI.   HEALTH MAINTENANCE:  PHYSICAL EXAMINATION: Blood pressure 111/73, pulse 88, temperature 98.2 F (36.8 C),  temperature source Oral, resp. rate 20, height 5\' 7"  (1.702 m), weight 170 lb 8 oz (77.338 kg). Body mass index is 26.7 kg/(m^2). ECOG PERFORMANCE STATUS: 0 - Asymptomatic This was a counseling session today so therefore physical exam was not performed  LABORATORY DATA: Lab Results  Component Value Date   WBC 3.6* 07/12/2012   HGB 13.1 07/12/2012   HCT 38.5 07/12/2012   MCV 86.4 07/12/2012   PLT 182 07/12/2012      Chemistry      Component Value Date/Time   NA 144 07/12/2012 1351   K 3.9 07/12/2012 1351   CL 106 07/12/2012 1351   CO2 28 07/12/2012 1351   BUN 16.5 07/12/2012 1351   CREATININE 0.8 07/12/2012 1351      Component Value Date/Time   CALCIUM 10.0 07/12/2012 1351   ALKPHOS 60 07/12/2012 1351   AST 18 07/12/2012 1351   ALT 17 07/12/2012 1351   BILITOT 0.97 07/12/2012 1351       RADIOGRAPHIC STUDIES:  No results found.  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 She is status post lumpectomy with sentinel lymph node biopsy in November 2013. Final pathology revealed DCIS that was ER positive PR positive HER-2/neu negative. Her HER-2 testing was on NSABP B. 43 clinical study.  #2 patient is status post radiation therapy completed in January 2014. Overall she tolerated it well.  #3 patient was begun on Aromasin 25 mg daily she began this in May. However after 3 weeks she was not able to tolerate it and she discontinued it. She is now feeling much better. She is not a candidate for tamoxifen. She is relating to me that she does not want to be treated with any kind of antiestrogen therapy due to the side effects that she has experienced. Her quality of life suffered greatly.  #4 patient and I discussed other ways of trying to reduce her risk of developing breast cancer including lifestyle modification with exercise eating healthy reducing stress reducing alcohol consumption. She is very committed to doing  this.   PLAN:  #1 this was a significantly helpful counseling session with the patient. We spent significant amount of time discussing her situation. She will come off of Aromasin. She will exercise at least 7 days a week. She will incorporate aerobics along with isometric exercises. She will follow a healthy diet plan. She will also reduce distress. She is very committed to this.  #2 I will plan on seeing her back in 2-3 months time for followup.   All questions were answered. The patient knows to call the clinic with any problems, questions or concerns. We can certainly see the patient much sooner if necessary.  I spent 40 minutes counseling the patient face to face. The total time spent in the appointment was 40 minutes.  Drue Second, MD Medical/Oncology [facility] [phone] (beeper) [phone] (Office)  07/12/2012, 3:13 PM

## 2012-07-15 ENCOUNTER — Encounter (INDEPENDENT_AMBULATORY_CARE_PROVIDER_SITE_OTHER): Payer: Medicare HMO | Admitting: Surgery

## 2012-07-30 ENCOUNTER — Encounter (INDEPENDENT_AMBULATORY_CARE_PROVIDER_SITE_OTHER): Payer: Self-pay | Admitting: Surgery

## 2012-07-30 ENCOUNTER — Ambulatory Visit (INDEPENDENT_AMBULATORY_CARE_PROVIDER_SITE_OTHER): Payer: Medicare HMO | Admitting: Surgery

## 2012-07-30 VITALS — BP 116/66 | HR 82 | Resp 16 | Ht 68.0 in | Wt 168.4 lb

## 2012-07-30 DIAGNOSIS — C50311 Malignant neoplasm of lower-inner quadrant of right female breast: Secondary | ICD-10-CM

## 2012-07-30 DIAGNOSIS — C50319 Malignant neoplasm of lower-inner quadrant of unspecified female breast: Secondary | ICD-10-CM

## 2012-07-30 NOTE — Progress Notes (Addendum)
Re:   Azariah Latendresse DOB:   1955-11-08 MRN:   [id number]  [facility]  ASSESSMENT AND PLAN: 1.  Right breast cancer, LIQ.  Tis, N0.  Stage 0.  DCIS and LCIS.  Final path - no residual cancer, 0/4 nodes.  Lumpectomy and SLNBx - 12/09/2011 - no residual cancer.  Completed radiation tx - 02/12/2012.  Tried Aromasin, but could not tolerate it.  So on no anti-estrogen.  Treating oncologists:  Welton Flakes Donnie Coffin) and [place].   I'll see her back in 6 months.  If she is doing well in 6 months, I can go to alternating visits with Dr. Welton Flakes.   2.  Lupus anticoagulant positive 3.  Hypercholesterolemia 4.  On aspirin 5.  History of narrow angle glaucoma - treated  Chief Complaint  Patient presents with  . Breast Cancer Long Term Follow Up   REFERRING PHYSICIAN:  Dr. [name]  HISTORY OF PRESENT ILLNESS: Desire Fulp is a 57 y.o. (DOB: Apr 13, 1955)  white female whose primary care physician is Dr. [name].  She comes for follow up of a right breast cancer.  Tried Aromasin, but could not tolerate it.  So she is not going to take an anti-estrogen. She talked about getting into a healthier life style as an alternative - particularly with exercise.  We also talked a little about the firmness at her biopsy site in the right breast.  History of Breast Cancer: She has no family history of breast cancer.  She did wear a Vivelle hormone patch for the past 5 years.  She sees Dr. Nicholas Lose.  She had a hysterectomy 1999 by Dr. Michaelle Copas for fibroids.  She had a right breast biopsy for microcalcifications.  She felt nothing abnormal in her breast.  She had a mammogram on 10/27/2011 that showed microcalcifications in the right breast.  This lead to a biopsy of the right breast.  The path is interesting in that it shows LCIS, DCIS, and possible invasive lobular carcinoma. Her MRI 11/14/2011 showed only post biopsy changes.    Past Medical History  Diagnosis Date  . Headache(784.0)     sinus  . Breast cancer 12/09/11   DCIS right breast, ER+  . Lupus anticoagulant positive   . Hypercholesterolemia   . Glaucoma, anatomical narrow angle     treated with laser surgery at [facility]  . Skin cancer     basal cell carcinoma; left side of mouth; removed bu Dr. Terri Piedra in office  . History of radiation therapy 01/12/12 -02/11/12    right breast     Current Outpatient Prescriptions  Medication Sig Dispense Refill  . aspirin EC 81 MG tablet Take 81 mg by mouth daily.      . Loratadine (CLARITIN REDITABS) 5 MG TBDP Take 5 mg by mouth daily as needed. For allergies      . simvastatin (ZOCOR) 20 MG tablet Take 20 mg by mouth daily.       No current facility-administered medications for this visit.     No Known Allergies  REVIEW OF SYSTEMS: GYN:  Hysterectomy 1999 by Dr. Michaelle Copas for fibroids.  Stopped Vivelle patch near the time of the breast cancer diagnosis. Hematologic:  Bruises easily and is tested positive for lupus anti-coagulant (leads to an increase propensity to clot).  SOCIAL and FAMILY HISTORY: Married. Husband with her. She lost her job spring of 2014.  [employer] was bought out and they did not need a marketing person.  She wants to  find another job and work at least 5 more years. Has 2 children 37 and 25.  PHYSICAL EXAM: BP 116/66  Pulse 82  Resp 16  Ht 5\' 8"  (1.727 m)  Wt 168 lb 6.4 oz (76.386 kg)  BMI 25.61 kg/m2  General: WN WF who is alert and generally healthy appearing.  HEENT: Normal. Pupils equal. Neck: Supple. No mass.  No thyroid mass. Lymph Nodes:  No supraclavicular, cervical nodes, or axillary nodes.  Right axillary wound looks good. Breast:  Right - Incision at 6 o'clock looks good, but she has a 3 cm firm smooth mass at the biopsy site.  This is consistant with post biopsy changes.  Left - Unremarkable.  No mass.  DATA REVIEWED: Mammogram scheduled   Ovidio Kin, MD,  [facility], [place] [address] [place]  [address]   [place], [place]     [address] Phone:  [phone] FAX:  [phone]

## 2012-10-05 ENCOUNTER — Telehealth: Payer: Self-pay | Admitting: Oncology

## 2012-10-05 NOTE — Telephone Encounter (Signed)
, °

## 2012-10-11 ENCOUNTER — Ambulatory Visit: Payer: Managed Care, Other (non HMO) | Admitting: Oncology

## 2012-10-11 ENCOUNTER — Other Ambulatory Visit: Payer: Managed Care, Other (non HMO) | Admitting: Lab

## 2012-10-19 ENCOUNTER — Other Ambulatory Visit: Payer: Self-pay | Admitting: Oncology

## 2012-10-19 DIAGNOSIS — Z9889 Other specified postprocedural states: Secondary | ICD-10-CM

## 2012-10-19 DIAGNOSIS — Z853 Personal history of malignant neoplasm of breast: Secondary | ICD-10-CM

## 2012-11-16 ENCOUNTER — Telehealth: Payer: Self-pay | Admitting: Oncology

## 2012-11-16 NOTE — Telephone Encounter (Signed)
, °

## 2012-11-17 ENCOUNTER — Ambulatory Visit
Admission: RE | Admit: 2012-11-17 | Discharge: 2012-11-17 | Disposition: A | Discharge status: Home / Self Care | Payer: Managed Care, Other (non HMO) | Source: Ambulatory Visit | Attending: Oncology | Admitting: Oncology

## 2012-11-17 DIAGNOSIS — Z9889 Other specified postprocedural states: Secondary | ICD-10-CM

## 2012-11-17 DIAGNOSIS — Z853 Personal history of malignant neoplasm of breast: Secondary | ICD-10-CM

## 2012-11-25 ENCOUNTER — Other Ambulatory Visit: Payer: Self-pay

## 2013-02-17 ENCOUNTER — Other Ambulatory Visit: Payer: Self-pay | Admitting: Emergency Medicine

## 2013-02-17 DIAGNOSIS — C50319 Malignant neoplasm of lower-inner quadrant of unspecified female breast: Secondary | ICD-10-CM

## 2013-02-18 ENCOUNTER — Telehealth: Payer: Self-pay | Admitting: Oncology

## 2013-02-18 ENCOUNTER — Encounter: Payer: Self-pay | Admitting: Oncology

## 2013-02-18 ENCOUNTER — Ambulatory Visit (HOSPITAL_BASED_OUTPATIENT_CLINIC_OR_DEPARTMENT_OTHER): Payer: BC Managed Care – PPO | Admitting: Oncology

## 2013-02-18 ENCOUNTER — Other Ambulatory Visit (HOSPITAL_BASED_OUTPATIENT_CLINIC_OR_DEPARTMENT_OTHER): Payer: BC Managed Care – PPO

## 2013-02-18 VITALS — BP 114/70 | HR 73 | Temp 98.6°F | Resp 18 | Ht 68.0 in | Wt 165.2 lb

## 2013-02-18 DIAGNOSIS — D0590 Unspecified type of carcinoma in situ of unspecified breast: Secondary | ICD-10-CM

## 2013-02-18 DIAGNOSIS — C50319 Malignant neoplasm of lower-inner quadrant of unspecified female breast: Secondary | ICD-10-CM

## 2013-02-18 DIAGNOSIS — Z170 Estrogen receptor positive status [ER+]: Secondary | ICD-10-CM

## 2013-02-18 LAB — CBC WITH DIFFERENTIAL/PLATELET
BASO%: 0.9 % (ref 0.0–2.0)
Basophils Absolute: 0 10*3/uL (ref 0.0–0.1)
EOS%: 4.4 % (ref 0.0–7.0)
Eosinophils Absolute: 0.1 10*3/uL (ref 0.0–0.5)
HCT: 39.6 % (ref 34.8–46.6)
HGB: 13 g/dL (ref 11.6–15.9)
LYMPH%: 32.2 % (ref 14.0–49.7)
MCH: 28.9 pg (ref 25.1–34.0)
MCHC: 32.8 g/dL (ref 31.5–36.0)
MCV: 88.1 fL (ref 79.5–101.0)
MONO#: 0.3 10*3/uL (ref 0.1–0.9)
MONO%: 11.1 % (ref 0.0–14.0)
NEUT#: 1.5 10*3/uL (ref 1.5–6.5)
NEUT%: 51.4 % (ref 38.4–76.8)
Platelets: 178 10*3/uL (ref 145–400)
RBC: 4.5 10*6/uL (ref 3.70–5.45)
RDW: 14.9 % — ABNORMAL HIGH (ref 11.2–14.5)
WBC: 3 10*3/uL — ABNORMAL LOW (ref 3.9–10.3)
lymph#: 1 10*3/uL (ref 0.9–3.3)

## 2013-02-18 LAB — COMPREHENSIVE METABOLIC PANEL (CC13)
ALT: 19 U/L (ref 0–55)
AST: 23 U/L (ref 5–34)
Albumin: 4.2 g/dL (ref 3.5–5.0)
Alkaline Phosphatase: 59 U/L (ref 40–150)
Anion Gap: 9 mEq/L (ref 3–11)
BUN: 17 mg/dL (ref 7.0–26.0)
CO2: 27 mEq/L (ref 22–29)
Calcium: 9.4 mg/dL (ref 8.4–10.4)
Chloride: 105 mEq/L (ref 98–109)
Creatinine: 0.8 mg/dL (ref 0.6–1.1)
Glucose: 81 mg/dl (ref 70–140)
Potassium: 3.9 mEq/L (ref 3.5–5.1)
Sodium: 141 mEq/L (ref 136–145)
Total Bilirubin: 0.95 mg/dL (ref 0.20–1.20)
Total Protein: 7 g/dL (ref 6.4–8.3)

## 2013-02-18 NOTE — Telephone Encounter (Signed)
, °

## 2013-02-18 NOTE — Progress Notes (Signed)
OFFICE PROGRESS NOTE  CC  Jani Gravel, MD [place] [address] Dr. Thea Silversmith Dr. Alphonsa Overall Dr. 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  PRIOR THERAPY:  #1 patient was originally seen in the multidisciplinary breast clinic after she had a screening mammogram that showed calcifications. She went on to have a biopsy that showed DCIS with lobular U. Plasia. The tumor was ER +100% PR +86%.  #2 she had a lumpectomy with sentinel lymph node biopsy on 12/09/2011 with the final pathology revealing lobular neoplasia (atypical hyperplasia and in situ carcinoma). 4 sentinel nodes were negative for metastatic disease. Tumor was ER positive PR positive final staging was P. Tis PN 0. Clinical stage 0.  #3 patient was enrolled on NSABP B. 43 and she was tested for the HER-2/neu receptor which was negative.  #4 patient has gone on to receive radiation therapy to the right breast which she completed on 02/12/2012.  #5 patient does have a history of lupus anticoagulant and had a full hypoechoic panel performed today results of which are pending.  #6 patient began chemoprevention with Aromasin 25 mg daily.Patient will begin this in late March 2014 since she is planning on going on a trip in mid March. This is now discontinue do to intolerance  CURRENT THERAPY:observation  INTERVAL HISTORY: Ericia Moxley 58 y.o. female returns for followup visit. Overall she is doing well. She is not on any antiestrogen therapy. She feels well. She has begun working full-time. She had lost about 10 pounds now she feels like she is gaining about 2 or 3 pounds back. But her activities have been limited due to the fact that she is working full-time. She is trying to continue to exercise and try to eat a healthy diet. She otherwise has no complaints related to her breast cancer. Remainder of the 10 point review  of systems is negative.  MEDICAL HISTORY: Past Medical History  Diagnosis Date  . Headache(784.0)     sinus  . Breast cancer 12/09/11    DCIS right breast, ER+  . Lupus anticoagulant positive   . Hypercholesterolemia   . Glaucoma, anatomical narrow angle     treated with laser surgery at [facility]  . Skin cancer     basal cell carcinoma; left side of mouth; removed bu Dr. Allyson Sabal in office  . History of radiation therapy 01/12/12 -02/11/12    right breast    ALLERGIES:  has No Known Allergies.  MEDICATIONS:  Current Outpatient Prescriptions  Medication Sig Dispense Refill  . aspirin EC 81 MG tablet Take 81 mg by mouth daily.      . Cholecalciferol (VITAMIN D-3 PO) Take 2,000 Units by mouth daily.      . Loratadine (CLARITIN REDITABS) 5 MG TBDP Take 5 mg by mouth daily as needed. For allergies      . simvastatin (ZOCOR) 20 MG tablet Take 20 mg by mouth daily.       No current facility-administered medications for this visit.    SURGICAL HISTORY:  Past Surgical History  Procedure Laterality Date  . Peripheral iridotomy  2009    bilateral  . Dilation and curettage of uterus    . Ganglion cyst excision      right  . Breast lumpectomy with needle localization and axillary sentinel lymph node bx  12/09/2011    Procedure: BREAST LUMPECTOMY WITH NEEDLE LOCALIZATION AND AXILLARY SENTINEL LYMPH NODE  BX;  Surgeon: Shann Medal, MD;  Location: [facility];  Service: General;  Laterality: Right;  . Abdominal hysterectomy  1999    partial; left ovaries    REVIEW OF SYSTEMS:  Pertinent items are noted in HPI.   HEALTH MAINTENANCE:  PHYSICAL EXAMINATION: Blood pressure 114/70, pulse 73, temperature 98.6 F (37 C), temperature source Oral, resp. rate 18, height 5' 8"  (1.727 m), weight 165 lb 3.2 oz (74.934 kg). Body mass index is 25.12 kg/(m^2). ECOG PERFORMANCE STATUS: 0 - Asymptomatic This was a counseling session today so therefore physical exam was not performed  LABORATORY  DATA: Lab Results  Component Value Date   WBC 3.0* 02/18/2013   HGB 13.0 02/18/2013   HCT 39.6 02/18/2013   MCV 88.1 02/18/2013   PLT 178 02/18/2013      Chemistry      Component Value Date/Time   NA 141 02/18/2013 0828   K 3.9 02/18/2013 0828   CL 106 07/12/2012 1351   CO2 27 02/18/2013 0828   BUN 17.0 02/18/2013 0828   CREATININE 0.8 02/18/2013 0828      Component Value Date/Time   CALCIUM 9.4 02/18/2013 0828   ALKPHOS 59 02/18/2013 0828   AST 23 02/18/2013 0828   ALT 19 02/18/2013 0828   BILITOT 0.95 02/18/2013 0828       RADIOGRAPHIC STUDIES:  No results found.  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 She is status post lumpectomy with sentinel lymph node biopsy in November 2013. Final pathology revealed DCIS that was ER positive PR positive HER-2/neu negative. Her HER-2 testing was on NSABP B. 43 clinical study.  #2 patient is status post radiation therapy completed in January 2014. Overall she tolerated it well.  #3 patient was begun on Aromasin 25 mg daily she began this in May. However after 3 weeks she was not able to tolerate it and she discontinued it. She has been off of all therapy overall she's doing well. She has no clinical evidence of recurrent disease.  #4 patient is up-to-date on her mammograms. She did have a bone density scan performed in June 2014 which showed low bone mass. We discussed exercise eating healthy taking vitamin D as well as calcium. She is followed by her gynecologist.  #5 I will plan on seeing the patient back in one year. She will also continue to see Dr. Lucia Gaskins. We will try to stagger the appointments.    All questions were answered. The patient knows to call the clinic with any problems, questions or concerns. We can certainly see the patient much sooner if necessary.  I spent 20 minutes counseling the patient face to face. The total time spent in the  appointment was 30 minutes.    Marcy Panning, MD Medical/Oncology [facility] [phone] (beeper) [phone] (Office)  02/18/2013, 9:27 AM

## 2013-02-18 NOTE — Patient Instructions (Signed)
Breast Cancer Survivor Follow-Up Breast cancer begins when cells in the breast divide too rapidly. The extra cells form a lump (tumor). When the cancer is treated, the goal is to get rid of all cancer cells. However, sometimes a few cells survive. These cancer cells can then grow. They become recurrent cancer. This means the cancer comes back after treatment.  Most cases of recurrent breast cancer develop 3 to 5 years after treatment. However, sometimes it comes back just a few months after treatment. Other times, it does not come back until years later. If the cancer comes back in the same area as the first breast cancer, it is called a local recurrence. If the cancer comes back somewhere else in the body, it is called regional recurrence if the site is fairly near the breast or distant recurrence if it is far from the breast. Your caregiver may also use the term metastasize to indicate a cancer that has gone to another part of your body. Treatment is still possible after either kind of recurrence. The cancer can still be controlled.  CAUSES OF RECURRENT CANCER No one knows exactly why breast cancer starts in the first place. Why the cancer comes back after treatment is also not clear. It is known that certain conditions, called risk factors, can make this more likely. They include:  Developing breast cancer for the first time before age 60.  Having breast cancer that involves the lymph nodes. These are small, round pieces of tissue found all over the body. Their job is to help fight infections.  Having a large tumor. Cancer is more apt to come back if the first tumor was bigger than 2 inches (5 cm).  Having certain types of breast cancer, such as:  Inflammatory breast cancer. This rare type grows rapidly and causes the breast to become red and swollen.  A high-grade tumor. The grade of a tumor indicates how fast it will grow and spread. High-grade tumors grow more quickly than other types.  HER2  cancer. This refers to the tumor's genetic makeup. Tumors that have this type of gene are more likely to come back after treatment.  Having close tumor margins. This refers to the space between the tumor and normal, noncancerous cells. If the space is small, the tumor has a greater chance of coming back.  Having treatment involving a surgery to remove the tumor but not the entire breast (lumpectomy) and no radiation therapy. CARE AFTER BREAST CANCER Home Monitoring Women who have had breast cancer should continue to examine their breasts every month. The goal is to catch the cancer quickly if it comes back. Many women find it helpful to do so on the same day each month and to mark the calendar as a reminder. Let your caregiver know immediately if you have any signs of recurrent breast cancer. Symptoms will vary, depending on where the cancer recurs. The original type of treatment can also make a difference. Symptoms of local recurrence after a lumpectomy or a recurrence in the opposite breast may include:  A new lump or thickening in the breast.  A change in the way the skin looks on the breast (such as a rash, dimpling, or wrinkling).  Redness or swelling of the breast.  Changes in the nipple (such as being red, puckered, swollen, or leaking fluid). Symptoms of a recurrence after a breast removal surgery (mastectomy) may include:  A lump or thickening under the skin.  A thickening around the mastectomy scar. Symptoms   of regional recurrence in the lymph nodes near the breast may include:  A lump under the arm or above the collarbone.  Swelling of the arm.  Pain in the arm, shoulder, or chest.  Numbness in the hand or arm. Symptoms of distant recurrence may include:  A cough that does not go away.  Trouble breathing or shortness of breath.  Pain in the bones or the chest. This is pain that lasts or does not respond to rest and medicine.  Headaches.  Sudden vision  problems.  Dizziness.  Nausea or vomiting.  Losing weight without trying to.  Persistent abdominal pain.  Changes in bowel movements or blood in the stool.  Yellowing of the skin or eyes (jaundice).  Blood in the urine or bloody vaginal discharge. Clinical Monitoring  It is helpful to keep a schedule of appointments for needed tests and exams. This includes physical exams, breast exams, exams of the lymph nodes, and general exams.  For the first 3 years after being treated for breast cancer, see your caregiver every 3 to 6 months.  For years 4 and 5 after breast cancer, see your caregiver every 6 to 12 months.  After 5 years, see your caregiver at least once a year.  Regular breast X-rays (mammograms) should continue even if you had a mastectomy.  A mammogram should be done 1 year after the mammogram that first detected breast cancer.  A mammogram should be done every 6 to 12 months after that. Follow your caregiver's advice.  A pelvic exam done by your caregiver checks whether female organs are the normal size and shape. The exam is usually done every year. Ask your caregiver if that schedule is right for you.  Women taking tamoxifen should report any vaginal bleeding immediately to their caregiver. Tamoxifen is often given to women with a certain type of breast cancer. It has been shown to help prevent recurrence.  You will need to decide who your primary caregiver will be.  Most people continue to see their cancer specialist (oncologist) every 3 to 6 months for the first year after cancer treatment.  At some point, you may want to go back to seeing your family caregiver. You would no longer see your oncologist for regular checkups. Many women do this about 1 year after their first diagnosis of breast cancer.  You will still need to be seen every so often by your oncologist. Ask how often that should be. Coordinate this with your family or primary caregiver.  Think about  having genetic counseling. This would provide information on traits that can be passed or inherited from one generation to the next. In some cases, breast cancer runs in families. Tell your caregiver if you:  Are of Ashkenazi Jewish heritage.  Have any family member who has had ovarian cancer.  Have a mother, sister, or daughter who had breast cancer before age 7.  Have 2 or more close relatives who have had breast cancer. This means a mother, sister, daughter, aunt, or grandmother.  Had breast cancer in both breasts.  Have a female relative who has had breast cancer.  Some tests are not recommended for routine screening. Someone recovering from breast cancer does not need to have these tests if there are no problems. The tests have risks, such as radiation exposure, and can be costly. The risks of these tests are thought to be greater than the benefits:  Blood tests.  Chest X-rays.  Bone scans.  Liver ultrasound.  Computed tomography (CT scan).  Positron emission tomography (PET scan).  Magnetic resonance imaging (MRI scan). DIAGNOSIS OF RECURRENT CANCER Recurrent breast cancer may be suspected for various reasons. A mammogram may not look normal. You might feel a lump or have other symptoms. Your caregiver may find something unusual during an exam. To be sure, your caregiver will probably order some tests. The tests are needed because there are symptoms or hints of a problem. They could include:  Blood tests, including a test to check how well the liver is working. The liver is a common site for a distant cancer recurrence.  Imaging tests that create pictures of the inside of the body. These tests include:  Chest X-rays to show if the cancer has come back in the lungs.  CT scans to create detailed pictures of various areas of the body and help find a distant recurrence.  MRI scans to find anything unusual in the breast, chest, or lymph nodes.  Breast ultrasound tests to  examine the breasts.  Bone scans to create a picture of your whole skeleton and find cancer in bony areas.  PET scans to create an image of the whole body. PET scans can be used together with CT scans to show more detail.  Biopsy. A small sample of tissue is taken and checked under a microscope. If cancer cells are found, they may be tested to see if they contain the HER2 gene or the hormones estrogen and progesterone. This will help your caregiver decide how to treat the recurrent cancer. TREATMENT  How recurrent breast cancer is treated depends on where the new cancer is found. The type of treatment that was used for the first breast cancer makes a difference, too. A combination of treatments may be used. Options include:  Surgery.  If the cancer comes back in the breast that was not treated before, you may need a lumpectomy or mastectomy.  If the cancer comes back in the breast that was treated before, you may need a mastectomy.  The lymph nodes under the arm may need to be removed.  Radiation therapy.  For a local recurrence, radiation may be used if it was not used during the first treatment.  For a distance recurrence, radiation is sometimes used.  Chemotherapy.  This may be used before surgery to treat recurrent breast cancer.  This may be used to treat recurrent cancer that cannot be treated with surgery.  This may be used to treat a distant recurrence.  Hormone therapy.  Women with the HER2 gene may be given hormone therapy to attack this gene. Document Released: 09/04/2010 Document Revised: 03/31/2011 Document Reviewed: 09/04/2010 Exit[employer] Patient Information 2014 [employer].  

## 2013-08-10 ENCOUNTER — Ambulatory Visit (INDEPENDENT_AMBULATORY_CARE_PROVIDER_SITE_OTHER): Payer: BC Managed Care – PPO | Admitting: Surgery

## 2013-09-08 ENCOUNTER — Ambulatory Visit (INDEPENDENT_AMBULATORY_CARE_PROVIDER_SITE_OTHER): Payer: BC Managed Care – PPO | Admitting: Surgery

## 2013-09-08 VITALS — BP 110/80 | HR 75 | Temp 98.0°F | Ht 68.0 in | Wt 169.2 lb

## 2013-09-08 DIAGNOSIS — C50311 Malignant neoplasm of lower-inner quadrant of right female breast: Secondary | ICD-10-CM

## 2013-09-08 DIAGNOSIS — C50319 Malignant neoplasm of lower-inner quadrant of unspecified female breast: Secondary | ICD-10-CM

## 2013-09-08 NOTE — Progress Notes (Signed)
Re:   Kristin Mills DOB:   Apr 11, 1955 MRN:   [id number]  [place]  ASSESSMENT AND PLAN: 1.  Right breast cancer, LIQ.  Tis, N0.  Stage 0.  DCIS and LCIS.  Final path - no residual cancer, 0/4 nodes.  Lumpectomy and SLNBx - 12/09/2011 - no residual cancer.  Completed radiation tx - 02/12/2012.  Tried Aromasin, but could not tolerate it.  So on no anti-estrogen.  Treating oncologists:  () and Kristin Mills.   There's probably no reason for her to see med onc - so I will just follow her q 6 months until year 5.  I'll see her back in 6 months.     2.  Lupus anticoagulant positive 3.  Hypercholesterolemia 4.  On aspirin 5.  History of narrow angle glaucoma - treated  Chief Complaint  Patient presents with  . Breast Cancer Long Term Follow Up   REFERRING PHYSICIAN:  Dr. Jani Mills  HISTORY OF PRESENT ILLNESS: Kristin Mills is a 58 y.o. (DOB: 1955-11-18)  white female whose primary care physician is Kristin Mills.  She comes for follow up of a right breast cancer.  Comes by herself today. She is doing well.  She still feels a mass at the biopsy site, but it is smaller. She has a job with [employer] in [employer].  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  History of Breast Cancer: She has no family history of breast cancer.  She did wear a Vivelle hormone patch for the past 5 years.  She sees Kristin Mills.  She had a hysterectomy 1999 by Kristin Mills for fibroids.  She had a right breast biopsy for microcalcifications.  She felt nothing abnormal in her breast.  She had a mammogram on 10/27/2011 that showed microcalcifications in the right breast.  This lead to a biopsy of the right breast.  The path is interesting in that it shows LCIS, DCIS, and possible invasive lobular carcinoma. Her MRI 11/14/2011 showed only post biopsy changes.  Tried Aromasin, but could not tolerate it.  So she is not going to take an anti-estrogen. We also talked a little about the  firmness at her biopsy site in the right breast.    Past Medical History  Diagnosis Date  . Headache(784.0)     sinus  . Breast cancer 12/09/11    DCIS right breast, ER+  . Lupus anticoagulant positive   . Hypercholesterolemia   . Glaucoma, anatomical narrow angle     treated with laser surgery at [facility]  . Skin cancer     basal cell carcinoma; left side of mouth; removed bu Kristin Mills in office  . History of radiation therapy 01/12/12 -02/11/12    right breast     Current Outpatient Prescriptions  Medication Sig Dispense Refill  . aspirin EC 81 MG tablet Take 81 mg by mouth daily.      . Cholecalciferol (VITAMIN D-3 PO) Take 2,000 Units by mouth daily.      . Loratadine (CLARITIN REDITABS) 5 MG TBDP Take 5 mg by mouth daily as needed. For allergies      . simvastatin (ZOCOR) 20 MG tablet Take 20 mg by mouth daily.       No current facility-administered medications for this visit.     No Known Allergies  REVIEW OF SYSTEMS: GYN:  Hysterectomy 1999 by Kristin Mills for fibroids.  Stopped Vivelle patch near the  time of the breast cancer diagnosis. Hematologic:  Bruises easily and is tested positive for lupus anti-coagulant (leads to an increase propensity to clot).  SOCIAL and FAMILY HISTORY: Married. Husband with her. She has a job with [employer] in [employer]. Has 2 children 106 and 25.  PHYSICAL EXAM: BP 110/80  Pulse 75  Temp(Src) 98 F (36.7 C) (Oral)  Ht 5\' 8"  (1.727 m)  Wt 169 lb 4 oz (76.771 kg)  BMI 25.74 kg/m2  General: WN WF who is alert and generally healthy appearing.  HEENT: Normal. Pupils equal. Neck: Supple. No mass.  No thyroid mass. Lymph Nodes:  No supraclavicular, cervical nodes, or axillary nodes.  Right axillary wound looks good. Breast:  Right - Incision at 6 o'clock looks good, but she has a 3 cm firm smooth mass at the biopsy site.  This is consistant with post biopsy changes.  The mass may be a little smaller.  Left - Unremarkable.  No  mass.  DATA REVIEWED: Mammogram ([facility]) - 11/17/12 - neg  Alphonsa Overall, MD,  [facility], [address]  Agency, [place]    [place] Phone:  [place]:  [phone]

## 2013-10-15 ENCOUNTER — Telehealth: Payer: Self-pay | Admitting: Hematology and Oncology

## 2013-10-15 NOTE — Telephone Encounter (Signed)
Lft msg for pt regarding labs/ov and MD change, mailed sch........ KJ °

## 2013-10-18 ENCOUNTER — Other Ambulatory Visit: Payer: Self-pay

## 2013-10-18 ENCOUNTER — Other Ambulatory Visit (INDEPENDENT_AMBULATORY_CARE_PROVIDER_SITE_OTHER): Payer: Self-pay | Admitting: Surgery

## 2013-10-18 DIAGNOSIS — Z853 Personal history of malignant neoplasm of breast: Secondary | ICD-10-CM

## 2013-11-14 ENCOUNTER — Other Ambulatory Visit (INDEPENDENT_AMBULATORY_CARE_PROVIDER_SITE_OTHER): Payer: Self-pay | Admitting: Surgery

## 2013-11-14 ENCOUNTER — Other Ambulatory Visit: Payer: Self-pay

## 2013-11-14 DIAGNOSIS — Z853 Personal history of malignant neoplasm of breast: Secondary | ICD-10-CM

## 2013-11-18 ENCOUNTER — Ambulatory Visit
Admission: RE | Admit: 2013-11-18 | Discharge: 2013-11-18 | Disposition: A | Discharge status: Home / Self Care | Payer: BC Managed Care – PPO | Source: Ambulatory Visit | Attending: Surgery | Admitting: Surgery

## 2013-11-18 DIAGNOSIS — Z853 Personal history of malignant neoplasm of breast: Secondary | ICD-10-CM

## 2014-02-01 ENCOUNTER — Ambulatory Visit (INDEPENDENT_AMBULATORY_CARE_PROVIDER_SITE_OTHER): Payer: BLUE CROSS/BLUE SHIELD

## 2014-02-01 ENCOUNTER — Encounter: Payer: Self-pay | Admitting: Podiatry

## 2014-02-01 ENCOUNTER — Ambulatory Visit (INDEPENDENT_AMBULATORY_CARE_PROVIDER_SITE_OTHER): Payer: BLUE CROSS/BLUE SHIELD | Admitting: Podiatry

## 2014-02-01 VITALS — BP 114/72 | HR 89 | Resp 16

## 2014-02-01 DIAGNOSIS — M79672 Pain in left foot: Secondary | ICD-10-CM

## 2014-02-01 DIAGNOSIS — M779 Enthesopathy, unspecified: Secondary | ICD-10-CM

## 2014-02-01 DIAGNOSIS — M2012 Hallux valgus (acquired), left foot: Secondary | ICD-10-CM

## 2014-02-01 DIAGNOSIS — M21612 Bunion of left foot: Secondary | ICD-10-CM

## 2014-02-01 MED ORDER — TRIAMCINOLONE ACETONIDE 10 MG/ML IJ SUSP
10.0000 mg | Freq: Once | INTRAMUSCULAR | Status: AC
  Administered 2014-02-01: 10 mg

## 2014-02-01 NOTE — Progress Notes (Signed)
Subjective:     Patient ID: Kristin Mills, female   DOB: [birth year], 59 y.o.   MRN: [id number]  HPI patient presents stating she's been getting pain in her forefoot left for about 3 months and she's noticed some movement of her second toe towards the big toe with inflammation and pain mostly with weightbearing or heel-type shoes   Review of Systems  All other systems reviewed and are negative.      Objective:   Physical Exam  Constitutional: She is oriented to person, place, and time.  Cardiovascular: Intact distal pulses.   Musculoskeletal: Normal range of motion.  Neurological: She is oriented to person, place, and time.  Skin: Skin is warm.  Nursing note and vitals reviewed.  neurovascular status intact with muscle strength adequate and range of motion subtalar and midtarsal joint within normal limits. Patient's noted to have good digital perfusion is well oriented 3 and was noted to have inflammation and pain around the second metatarsophalangeal joint with mild medial dislocation of the second toe occurring over the last several months     Assessment:     Inflammatory capsulitis second MPJ left with probable flexor plate stretch    Plan:     H&P and condition discussed explained along with x-ray review. At this time I explained the risk of capsular injection and patient wants the risk and I did a proximal nerve block 60 mg Xylocaine Marcaine mixture aspirated the second MPJ getting a small amount of clear fluid and then injected with half cc of dexamethasone Kenalog and applied thick padding. Instructed on rigid bottom shoes and reappoint in 3 weeks for consideration of orthotics

## 2014-02-01 NOTE — Progress Notes (Signed)
   Subjective:    Patient ID: Kristin Mills, female    DOB: January 20, 1956, 59 y.o.   MRN: [id number]  HPI Comments: "I have a problem with the left foot"  Patient c/o aching plantar forefoot left for few months. Aggravated initially by wearing high heel sandals for a long time. The area swells and the 2nd and 3rd toes separate occasionally. She went to PCP and xrayed and said no fracture, thought just arthritis. Tried better shoes.  Foot Pain      Review of Systems  HENT: Positive for sinus pressure and tinnitus.   Hematological: Bruises/bleeds easily.  All other systems reviewed and are negative.      Objective:   Physical Exam        Assessment & Plan:

## 2014-02-16 ENCOUNTER — Other Ambulatory Visit: Payer: Self-pay | Admitting: *Deleted

## 2014-02-16 DIAGNOSIS — C50319 Malignant neoplasm of lower-inner quadrant of unspecified female breast: Secondary | ICD-10-CM

## 2014-02-17 ENCOUNTER — Ambulatory Visit: Payer: BC Managed Care – PPO | Admitting: Oncology

## 2014-02-17 ENCOUNTER — Other Ambulatory Visit (HOSPITAL_BASED_OUTPATIENT_CLINIC_OR_DEPARTMENT_OTHER): Payer: BLUE CROSS/BLUE SHIELD

## 2014-02-17 ENCOUNTER — Telehealth: Payer: Self-pay | Admitting: Hematology and Oncology

## 2014-02-17 ENCOUNTER — Ambulatory Visit (HOSPITAL_BASED_OUTPATIENT_CLINIC_OR_DEPARTMENT_OTHER): Payer: BLUE CROSS/BLUE SHIELD | Admitting: Hematology and Oncology

## 2014-02-17 ENCOUNTER — Other Ambulatory Visit: Payer: BC Managed Care – PPO

## 2014-02-17 VITALS — BP 109/90 | HR 75 | Temp 98.0°F | Resp 18 | Ht 68.0 in | Wt 171.4 lb

## 2014-02-17 DIAGNOSIS — Z170 Estrogen receptor positive status [ER+]: Secondary | ICD-10-CM

## 2014-02-17 DIAGNOSIS — D0511 Intraductal carcinoma in situ of right breast: Secondary | ICD-10-CM

## 2014-02-17 DIAGNOSIS — C50311 Malignant neoplasm of lower-inner quadrant of right female breast: Secondary | ICD-10-CM

## 2014-02-17 DIAGNOSIS — C50319 Malignant neoplasm of lower-inner quadrant of unspecified female breast: Secondary | ICD-10-CM

## 2014-02-17 LAB — CBC WITH DIFFERENTIAL/PLATELET
BASO%: 0.8 % (ref 0.0–2.0)
Basophils Absolute: 0 10*3/uL (ref 0.0–0.1)
EOS%: 3.7 % (ref 0.0–7.0)
Eosinophils Absolute: 0.2 10*3/uL (ref 0.0–0.5)
HCT: 41.1 % (ref 34.8–46.6)
HGB: 13.2 g/dL (ref 11.6–15.9)
LYMPH%: 29.7 % (ref 14.0–49.7)
MCH: 28.3 pg (ref 25.1–34.0)
MCHC: 32 g/dL (ref 31.5–36.0)
MCV: 88.4 fL (ref 79.5–101.0)
MONO#: 0.4 10*3/uL (ref 0.1–0.9)
MONO%: 8.6 % (ref 0.0–14.0)
NEUT#: 2.4 10*3/uL (ref 1.5–6.5)
NEUT%: 57.2 % (ref 38.4–76.8)
Platelets: 203 10*3/uL (ref 145–400)
RBC: 4.64 10*6/uL (ref 3.70–5.45)
RDW: 15.3 % — ABNORMAL HIGH (ref 11.2–14.5)
WBC: 4.1 10*3/uL (ref 3.9–10.3)
lymph#: 1.2 10*3/uL (ref 0.9–3.3)

## 2014-02-17 LAB — COMPREHENSIVE METABOLIC PANEL (CC13)
ALT: 18 U/L (ref 0–55)
AST: 23 U/L (ref 5–34)
Albumin: 4.2 g/dL (ref 3.5–5.0)
Alkaline Phosphatase: 62 U/L (ref 40–150)
Anion Gap: 9 mEq/L (ref 3–11)
BUN: 17.5 mg/dL (ref 7.0–26.0)
CO2: 26 mEq/L (ref 22–29)
Calcium: 8.9 mg/dL (ref 8.4–10.4)
Chloride: 108 mEq/L (ref 98–109)
Creatinine: 0.8 mg/dL (ref 0.6–1.1)
EGFR: 80 mL/min/{1.73_m2} — ABNORMAL LOW (ref >90)
Glucose: 81 mg/dl (ref 70–140)
Potassium: 4.2 mEq/L (ref 3.5–5.1)
Sodium: 143 mEq/L (ref 136–145)
Total Bilirubin: 0.91 mg/dL (ref 0.20–1.20)
Total Protein: 7.2 g/dL (ref 6.4–8.3)

## 2014-02-17 NOTE — Assessment & Plan Note (Signed)
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 Mammogram 11/18/2013 is normal 2. Breast exam 02/17/2014 is normal  Survivorship:Discussed the importance of physical exercise in decreasing the likelihood of breast cancer recurrence. Recommended 30 mins daily 6 days a week of either brisk walking or cycling or swimming. Encouraged patient to eat more fruits and vegetables and decrease red meat.   Return to clinic annually for follow-up.

## 2014-02-17 NOTE — Telephone Encounter (Signed)
, °

## 2014-02-20 NOTE — Progress Notes (Signed)
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 43 and she was tested for the HER-2/neu receptor which was negative   01/12/2012 - 02/12/2012 Radiation Therapy Adjuvant radiation therapy   03/23/2012 - 04/12/2012 Anti-estrogen oral therapy Aromasin 25 mg daily discontinued due to intolerance    CHIEF COMPLIANT: Follow-up of breast cancer  INTERVAL HISTORY: Kristin Mills is a 59 year old lady with a history of right-sided breast cancer who had initial lumpectomy followed by radiation and has taken Aromasin for a few weeks and discontinued it because it made her head feel cloudy. However since stopping Aromasin she has felt back to her normal self. Denies any new problems or concerns. She had a mammogram in October 2015 was normal.  REVIEW OF SYSTEMS:   Constitutional: Denies fevers, chills or abnormal weight loss Eyes: Denies blurriness of vision Ears, nose, mouth, throat, and face: Denies mucositis or sore throat Respiratory: Denies cough, dyspnea or wheezes Cardiovascular: Denies palpitation, chest discomfort or lower extremity  swelling Gastrointestinal:  Denies nausea, heartburn or change in bowel habits Skin: Denies abnormal skin rashes Lymphatics: Denies new lymphadenopathy or easy bruising Neurological:Denies numbness, tingling or new weaknesses Behavioral/Psych: Mood is stable, no new changes  Breast:  denies any pain or lumps or nodules in either breasts All other systems were reviewed with the patient and are negative.  I have reviewed the past medical history, past surgical history, social history and family history with the patient and they are unchanged from previous note.  ALLERGIES:  has No Known Allergies.  MEDICATIONS:  Current Outpatient Prescriptions  Medication Sig Dispense Refill  . aspirin EC 81 MG tablet Take 81 mg by mouth daily.    . Cholecalciferol (VITAMIN D-3 PO) Take 2,000 Units by mouth daily.    . Loratadine (CLARITIN REDITABS) 5 MG TBDP Take 5 mg by mouth daily as needed. For allergies    . magnesium oxide (MAG-OX) 400 MG tablet Take 400 mg by mouth daily.    . Multiple Vitamin (MULTIVITAMIN) capsule Take 1 capsule by mouth daily.    . simvastatin (ZOCOR) 20 MG tablet Take 20 mg by mouth daily.     No current facility-administered medications for this visit.    PHYSICAL EXAMINATION: ECOG PERFORMANCE STATUS: 0 - Asymptomatic  Filed Vitals:   02/17/14 1117  BP: 109/90  Pulse: 75  Temp: 98 F (36.7 C)  Resp: 18   Filed Weights   02/17/14 1117  Weight: 171 lb 6.4 oz (77.747 kg)    GENERAL:alert, no distress and comfortable SKIN: skin color, texture, turgor are normal, no rashes or significant lesions EYES: normal, Conjunctiva are pink and non-injected, sclera  clear OROPHARYNX:no exudate, no erythema and lips, buccal mucosa, and tongue normal  NECK: supple, thyroid normal size, non-tender, without nodularity LYMPH:  no palpable lymphadenopathy in the cervical, axillary or inguinal LUNGS: clear to auscultation and percussion with normal breathing effort HEART: regular  rate & rhythm and no murmurs and no lower extremity edema ABDOMEN:abdomen soft, non-tender and normal bowel sounds Musculoskeletal:no cyanosis of digits and no clubbing  NEURO: alert & oriented x 3 with fluent speech, no focal motor/sensory deficits BREAST: No palpable masses or nodules in either right or left breasts.  No palpable axillary supraclavicular or infraclavicular adenopathy no breast tenderness or nipple discharge. (exam performed in the presence of a chaperone)  LABORATORY DATA:  I have reviewed the data as listed   Chemistry      Component Value Date/Time   NA 143 02/17/2014 1055   K 4.2 02/17/2014 1055   CL 106 07/12/2012 1351   CO2 26 02/17/2014 1055   BUN 17.5 02/17/2014 1055   CREATININE 0.8 02/17/2014 1055      Component Value Date/Time   CALCIUM 8.9 02/17/2014 1055   ALKPHOS 62 02/17/2014 1055   AST 23 02/17/2014 1055   ALT 18 02/17/2014 1055   BILITOT 0.91 02/17/2014 1055       Lab Results  Component Value Date   WBC 4.1 02/17/2014   HGB 13.2 02/17/2014   HCT 41.1 02/17/2014   MCV 88.4 02/17/2014   PLT 203 02/17/2014   NEUTROABS 2.4 02/17/2014     RADIOGRAPHIC STUDIES: I have personally reviewed the radiology reports and agreed with their findings. 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 Mammogram 11/18/2013 is normal 2. Breast exam 02/17/2014 is normal  Survivorship:Discussed the importance of physical exercise in decreasing the likelihood of breast cancer recurrence. Recommended 30 mins daily 6 days a week of either brisk walking or cycling or swimming. Encouraged patient to eat more  fruits and vegetables and decrease red meat.   Return to clinic annually for follow-up.    Orders Placed This Encounter  Procedures  . CBC with Differential    Standing Status: Future     Number of Occurrences:      Standing Expiration Date: 02/17/2015  . Comprehensive metabolic panel (Cmet) - [facility]    Standing Status: Future     Number of Occurrences:      Standing Expiration Date: 02/17/2015   The patient has a good understanding of the overall plan. she agrees with it. She will call with any problems that may develop before her next visit here.   Rulon Eisenmenger, MD

## 2014-02-22 ENCOUNTER — Encounter: Payer: Self-pay | Admitting: Podiatry

## 2014-02-22 ENCOUNTER — Ambulatory Visit (INDEPENDENT_AMBULATORY_CARE_PROVIDER_SITE_OTHER): Payer: BLUE CROSS/BLUE SHIELD | Admitting: Podiatry

## 2014-02-22 DIAGNOSIS — M779 Enthesopathy, unspecified: Secondary | ICD-10-CM

## 2014-02-22 NOTE — Progress Notes (Signed)
Subjective:     Patient ID: Kristin Mills, female   DOB: May 10, 1955, 59 y.o.   MRN: [id number]  HPI patient states she's doing quite a bit better with her left foot with discomfort that has reduced quite a bit and toe that still is malpositioned but not tender as it was previously   Review of Systems     Objective:   Physical Exam Neurovascular status intact with no change in health history and noted to have significant diminishment of discomfort second MPJ left with medial and slight dorsal dislocation of the toe still noted and moderate discomfort when pressed deep    Assessment:     Probable flexor plate deformity of the left second toe with reduction of the acute inflammation    Plan:     H&P and condition discussed. I have recommended orthotics to try to reduce the stress against the joint and patient is scanned for customized orthotic devices

## 2014-03-17 ENCOUNTER — Other Ambulatory Visit: Payer: BLUE CROSS/BLUE SHIELD

## 2014-03-21 ENCOUNTER — Ambulatory Visit (INDEPENDENT_AMBULATORY_CARE_PROVIDER_SITE_OTHER): Payer: BLUE CROSS/BLUE SHIELD | Admitting: Podiatry

## 2014-03-21 DIAGNOSIS — M779 Enthesopathy, unspecified: Secondary | ICD-10-CM

## 2014-03-21 NOTE — Progress Notes (Signed)
Pt is here to PUO 

## 2014-03-21 NOTE — Patient Instructions (Signed)

## 2014-04-18 DIAGNOSIS — M779 Enthesopathy, unspecified: Secondary | ICD-10-CM

## 2014-05-04 ENCOUNTER — Encounter: Payer: Self-pay | Admitting: *Deleted

## 2014-05-04 NOTE — Progress Notes (Signed)
Received labs from [facility], sent to scan.

## 2014-11-14 ENCOUNTER — Other Ambulatory Visit: Payer: Self-pay

## 2014-11-14 ENCOUNTER — Other Ambulatory Visit: Payer: Self-pay | Admitting: Internal Medicine

## 2014-11-14 ENCOUNTER — Other Ambulatory Visit: Payer: Self-pay | Admitting: Surgery

## 2014-11-14 DIAGNOSIS — Z853 Personal history of malignant neoplasm of breast: Secondary | ICD-10-CM

## 2014-11-20 ENCOUNTER — Other Ambulatory Visit: Payer: Self-pay

## 2014-11-20 ENCOUNTER — Other Ambulatory Visit: Payer: Self-pay | Admitting: Surgery

## 2014-11-20 DIAGNOSIS — Z853 Personal history of malignant neoplasm of breast: Secondary | ICD-10-CM

## 2014-11-22 ENCOUNTER — Ambulatory Visit
Admission: RE | Admit: 2014-11-22 | Discharge: 2014-11-22 | Disposition: A | Discharge status: Home / Self Care | Payer: BLUE CROSS/BLUE SHIELD | Source: Ambulatory Visit | Attending: Surgery | Admitting: Surgery

## 2014-11-22 DIAGNOSIS — Z853 Personal history of malignant neoplasm of breast: Secondary | ICD-10-CM

## 2015-02-19 ENCOUNTER — Other Ambulatory Visit: Payer: Self-pay

## 2015-02-19 DIAGNOSIS — C50311 Malignant neoplasm of lower-inner quadrant of right female breast: Secondary | ICD-10-CM

## 2015-02-20 ENCOUNTER — Ambulatory Visit: Payer: BLUE CROSS/BLUE SHIELD | Admitting: Hematology and Oncology

## 2015-02-20 ENCOUNTER — Other Ambulatory Visit: Payer: BLUE CROSS/BLUE SHIELD

## 2015-03-11 NOTE — Assessment & Plan Note (Deleted)
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 Mammogram 11/22/14 is normal, Density cat B 2. Breast exam 03/11/15 is normal   Return to clinic annually for follow-up.

## 2015-03-12 ENCOUNTER — Ambulatory Visit: Payer: BLUE CROSS/BLUE SHIELD | Admitting: Hematology and Oncology

## 2015-03-12 ENCOUNTER — Other Ambulatory Visit: Payer: BLUE CROSS/BLUE SHIELD

## 2015-05-16 ENCOUNTER — Telehealth: Payer: Self-pay | Admitting: Hematology and Oncology

## 2015-05-16 ENCOUNTER — Other Ambulatory Visit (HOSPITAL_BASED_OUTPATIENT_CLINIC_OR_DEPARTMENT_OTHER): Payer: BLUE CROSS/BLUE SHIELD

## 2015-05-16 ENCOUNTER — Encounter: Payer: Self-pay | Admitting: Hematology and Oncology

## 2015-05-16 ENCOUNTER — Ambulatory Visit (HOSPITAL_BASED_OUTPATIENT_CLINIC_OR_DEPARTMENT_OTHER): Payer: BLUE CROSS/BLUE SHIELD | Admitting: Hematology and Oncology

## 2015-05-16 VITALS — BP 102/68 | HR 81 | Temp 97.9°F | Resp 18 | Wt 171.1 lb

## 2015-05-16 DIAGNOSIS — C50311 Malignant neoplasm of lower-inner quadrant of right female breast: Secondary | ICD-10-CM

## 2015-05-16 DIAGNOSIS — C50319 Malignant neoplasm of lower-inner quadrant of unspecified female breast: Secondary | ICD-10-CM

## 2015-05-16 DIAGNOSIS — C50312 Malignant neoplasm of lower-inner quadrant of left female breast: Secondary | ICD-10-CM

## 2015-05-16 DIAGNOSIS — Z853 Personal history of malignant neoplasm of breast: Secondary | ICD-10-CM

## 2015-05-16 LAB — COMPREHENSIVE METABOLIC PANEL
ALT: 15 U/L (ref 0–55)
AST: 21 U/L (ref 5–34)
Albumin: 4.4 g/dL (ref 3.5–5.0)
Alkaline Phosphatase: 57 U/L (ref 40–150)
Anion Gap: 7 mEq/L (ref 3–11)
BUN: 19.3 mg/dL (ref 7.0–26.0)
CO2: 28 mEq/L (ref 22–29)
Calcium: 9.9 mg/dL (ref 8.4–10.4)
Chloride: 107 mEq/L (ref 98–109)
Creatinine: 0.9 mg/dL (ref 0.6–1.1)
EGFR: 69 mL/min/{1.73_m2} — ABNORMAL LOW (ref >90)
Glucose: 80 mg/dl (ref 70–140)
Potassium: 4.3 mEq/L (ref 3.5–5.1)
Sodium: 142 mEq/L (ref 136–145)
Total Bilirubin: 1.13 mg/dL (ref 0.20–1.20)
Total Protein: 7.5 g/dL (ref 6.4–8.3)

## 2015-05-16 LAB — CBC WITH DIFFERENTIAL/PLATELET
BASO%: 0.8 % (ref 0.0–2.0)
Basophils Absolute: 0 10*3/uL (ref 0.0–0.1)
EOS%: 2.9 % (ref 0.0–7.0)
Eosinophils Absolute: 0.1 10*3/uL (ref 0.0–0.5)
HCT: 39.1 % (ref 34.8–46.6)
HGB: 13.3 g/dL (ref 11.6–15.9)
LYMPH%: 30.4 % (ref 14.0–49.7)
MCH: 29.2 pg (ref 25.1–34.0)
MCHC: 34 g/dL (ref 31.5–36.0)
MCV: 85.7 fL (ref 79.5–101.0)
MONO#: 0.4 10*3/uL (ref 0.1–0.9)
MONO%: 10.1 % (ref 0.0–14.0)
NEUT#: 2.2 10*3/uL (ref 1.5–6.5)
NEUT%: 55.8 % (ref 38.4–76.8)
Platelets: 178 10*3/uL (ref 145–400)
RBC: 4.56 10*6/uL (ref 3.70–5.45)
RDW: 14.5 % (ref 11.2–14.5)
WBC: 3.9 10*3/uL (ref 3.9–10.3)
lymph#: 1.2 10*3/uL (ref 0.9–3.3)

## 2015-05-16 NOTE — Progress Notes (Signed)
Unable to get in to exam room prior to MD.  No assessment performed.  

## 2015-05-16 NOTE — Assessment & Plan Note (Signed)
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 Mammogram 11/22/14 is normal, Density cat B 2. Breast exam 05/16/2015 is normal  Return to clinic annually for follow-up with survivorship clinic.

## 2015-05-16 NOTE — Progress Notes (Signed)
 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 43 and she was tested for the HER-2/neu receptor which was negative   01/12/2012 - 02/12/2012 Radiation Therapy Adjuvant radiation therapy   03/23/2012 - 04/12/2012 Anti-estrogen oral therapy Aromasin 25 mg daily discontinued due to intolerance    CHIEF COMPLIANT: Follow-up of breast cancer  INTERVAL HISTORY: Kristin Mills is a 60-year-old above-mentioned history right breast cancer who underwent lumpectomy followed by adjuvant radiation and is currently in surveillance as she could not tolerate antiestrogen therapy. She reports no major problems other than some mild-to-moderate fatigue. She and her husband had downsized their house and have moved into a condo. This has been quite stressful on her. She is also working full-time. She doesn't seem to find enough time for herself.  REVIEW OF SYSTEMS:   Constitutional: Denies fevers, chills or abnormal weight loss Eyes: Denies blurriness of vision Ears, nose, mouth, throat, and face: Denies mucositis or sore  throat Respiratory: Denies cough, dyspnea or wheezes Cardiovascular: Denies palpitation, chest discomfort Gastrointestinal:  Denies nausea, heartburn or change in bowel habits Skin: Denies abnormal skin rashes Lymphatics: Denies new lymphadenopathy or easy bruising Neurological:Denies numbness, tingling or new weaknesses Behavioral/Psych: Mood is stable, no new changes  Extremities: No lower extremity edema Breast:  denies any pain or lumps or nodules in either breasts All other systems were reviewed with the patient and are negative.  I have reviewed the past medical history, past surgical history, social history and family history with the patient and they are unchanged from previous note.  ALLERGIES:  has No Known Allergies.  MEDICATIONS:  Current Outpatient Prescriptions  Medication Sig Dispense Refill  . aspirin EC 81 MG tablet Take 81 mg by mouth daily.    . Cholecalciferol (VITAMIN D-3 PO) Take 2,000 Units by mouth daily.    . Loratadine (CLARITIN REDITABS) 5 MG TBDP Take 5 mg by mouth daily as needed. For allergies    . magnesium oxide (MAG-OX) 400 MG tablet Take 400 mg by mouth daily.    . Multiple Vitamin (MULTIVITAMIN) capsule Take 1 capsule by mouth daily.    . simvastatin (ZOCOR) 20 MG tablet Take 20 mg by mouth daily.     No current facility-administered medications for this visit.    PHYSICAL EXAMINATION: ECOG PERFORMANCE STATUS: 0 - Asymptomatic  Filed Vitals:   05/16/15 1456  BP: 102/68  Pulse: 81  Temp: 97.9 F (36.6 C)  Resp: 18   Filed Weights   05/16/15 1456  Weight: 171 lb 1.6 oz (77.61 kg)    GENERAL:alert, no distress and comfortable SKIN: skin color, texture, turgor are normal, no   rashes or significant lesions EYES: normal, Conjunctiva are pink and non-injected, sclera clear OROPHARYNX:no exudate, no erythema and lips, buccal mucosa, and tongue normal  NECK: supple, thyroid normal size, non-tender, without nodularity LYMPH:  no palpable  lymphadenopathy in the cervical, axillary or inguinal LUNGS: clear to auscultation and percussion with normal breathing effort HEART: regular rate & rhythm and no murmurs and no lower extremity edema ABDOMEN:abdomen soft, non-tender and normal bowel sounds MUSCULOSKELETAL:no cyanosis of digits and no clubbing  NEURO: alert & oriented x 3 with fluent speech, no focal motor/sensory deficits EXTREMITIES: No lower extremity edema BREAST: No palpable masses or nodules in either right or left breasts. No palpable axillary supraclavicular or infraclavicular adenopathy no breast tenderness or nipple discharge. (exam performed in the presence of a chaperone)  LABORATORY DATA:  I have reviewed the data as listed   Chemistry      Component Value Date/Time   NA 142 05/16/2015 1446   K 4.3 05/16/2015 1446   CL 106 07/12/2012 1351   CO2 28 05/16/2015 1446   BUN 19.3 05/16/2015 1446   CREATININE 0.9 05/16/2015 1446      Component Value Date/Time   CALCIUM 9.9 05/16/2015 1446   ALKPHOS 57 05/16/2015 1446   AST 21 05/16/2015 1446   ALT 15 05/16/2015 1446   BILITOT 1.13 05/16/2015 1446       Lab Results  Component Value Date   WBC 3.9 05/16/2015   HGB 13.3 05/16/2015   HCT 39.1 05/16/2015   MCV 85.7 05/16/2015   PLT 178 05/16/2015   NEUTROABS 2.2 05/16/2015   ASSESSMENT & PLAN:  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 Mammogram 11/22/14 is normal, Density cat B 2. Breast exam 05/16/2015 is normal  Return to clinic annually for follow-up with survivorship clinic.   Orders Placed This Encounter  Procedures  . Amb Referral to Survivorship Long term    Referral Priority:   Routine    Referral Type:  Consultation    Number of Visits Requested:  1   The patient has a good understanding of the overall plan. she agrees with it. she will call with any problems that may develop before the next visit here.   Gudena, Vinay K, MD 05/16/2015      

## 2015-05-16 NOTE — Telephone Encounter (Signed)
appt made and avs printed °

## 2015-07-12 ENCOUNTER — Ambulatory Visit (INDEPENDENT_AMBULATORY_CARE_PROVIDER_SITE_OTHER): Payer: BLUE CROSS/BLUE SHIELD | Admitting: Internal Medicine

## 2015-07-12 ENCOUNTER — Encounter: Payer: Self-pay | Admitting: Internal Medicine

## 2015-07-12 VITALS — BP 126/80 | HR 85 | Ht 68.0 in | Wt 172.8 lb

## 2015-07-12 DIAGNOSIS — R002 Palpitations: Secondary | ICD-10-CM | POA: Diagnosis not present

## 2015-07-12 NOTE — Patient Instructions (Addendum)
Medication Instructions:  Your physician recommends that you continue on your current medications as directed. Please refer to the Current Medication list given to you today.  Labwork: None ordered  Testing/Procedures: Your physician has requested that you have an echocardiogram. Echocardiography is a painless test that uses sound waves to create images of your heart. It provides your doctor with information about the size and shape of your heart and how well your heart's chambers and valves are working. This procedure takes approximately one hour. There are no restrictions for this procedure.  Your physician has recommended that you wear an event monitor. Event monitors are medical devices that record the heart's electrical activity. Doctors most often [place] these monitors to diagnose arrhythmias. Arrhythmias are problems with the speed or rhythm of the heartbeat. The monitor is a small, portable device. You can wear one while you do your normal daily activities. This is usually used to diagnose what is causing palpitations/syncope (passing out).  Follow-Up: Your physician recommends that you schedule a follow-up appointment in: 6 weeks with Dr. Lovena Le.  (after event monitor is completed)  If you need a refill on your cardiac medications before your next appointment, please call your pharmacy.  Thank you for choosing [facility]!!     Any Other Special Instructions Will Be Listed Below (If Applicable). Cardiac Event Monitoring A cardiac event monitor is a small recording device used to help detect abnormal heart rhythms (arrhythmias). The monitor is used to record heart rhythm when noticeable symptoms such as the following occur:  Fast heartbeats (palpitations), such as heart racing or fluttering.  Dizziness.  Fainting or light-headedness.  Unexplained weakness. The monitor is wired to two electrodes placed on your chest. Electrodes are flat, sticky disks that attach to your skin.  The monitor can be worn for up to 30 days. You will wear the monitor at all times, except when bathing.  HOW TO USE YOUR CARDIAC EVENT MONITOR A technician will prepare your chest for the electrode placement. The technician will show you how to place the electrodes, how to work the monitor, and how to replace the batteries. Take time to practice using the monitor before you leave the office. Make sure you understand how to send the information from the monitor to your health care provider. This requires a telephone with a landline, not a cell phone. You need to:  Wear your monitor at all times, except when you are in water:  Do not get the monitor wet.  Take the monitor off when bathing. Do not swim or use a hot tub with it on.  Keep your skin clean. Do not put body lotion or moisturizer on your chest.  Change the electrodes daily or any time they stop sticking to your skin. You might need to use tape to keep them on.  It is possible that your skin under the electrodes could become irritated. To keep this from happening, try to put the electrodes in slightly different places on your chest. However, they must remain in the area under your left breast and in the upper right section of your chest.  Make sure the monitor is safely clipped to your clothing or in a location close to your body that your health care provider recommends.  Press the button to record when you feel symptoms of heart trouble, such as dizziness, weakness, light-headedness, palpitations, thumping, shortness of breath, unexplained weakness, or a fluttering or racing heart. The monitor is always on and records what happened slightly  before you pressed the button, so do not worry about being too late to get good information.  Keep a diary of your activities, such as walking, doing chores, and taking medicine. It is especially important to note what you were doing when you pushed the button to record your symptoms. This will help  your health care provider determine what might be contributing to your symptoms. The information stored in your monitor will be reviewed by your health care provider alongside your diary entries.  Send the recorded information as recommended by your health care provider. It is important to understand that it will take some time for your health care provider to process the results.  Change the batteries as recommended by your health care provider. SEEK IMMEDIATE MEDICAL CARE IF:   You have chest pain.  You have extreme difficulty breathing or shortness of breath.  You develop a very fast heartbeat that persists.  You develop dizziness that does not go away.  You faint or constantly feel you are about to faint.   This information is not intended to replace advice given to you by your health care provider. Make sure you discuss any questions you have with your health care provider.   Document Released: 10/16/2007 Document Revised: 01/27/2014 Document Reviewed: 07/05/2012 Elsevier Interactive Patient Education [employer].

## 2015-07-12 NOTE — Progress Notes (Signed)
HPI Kristin Mills is referred today for evaluation of palpitations. She is a pleasant middle aged woman with a h/o palpitations dating back several months. The episodes last a few minutes. She has not had any sustained heart racing. She gives a h/o 2 episodes of finding herself on the ground. She is not sure if she passed out or fell but did not know why she fell. No h/o seizure activity or tongue biting or loss of continence. At times she will have palpitations while eating.  No Known Allergies   Current Outpatient Prescriptions  Medication Sig Dispense Refill  . aspirin EC 81 MG tablet Take 81 mg by mouth 3 (three) times a week.     . Cholecalciferol (VITAMIN D-3 PO) Take 2,000 Units by mouth daily.    . Loratadine (CLARITIN REDITABS) 5 MG TBDP Take 10 mg by mouth daily as needed. For allergies    . magnesium oxide (MAG-OX) 400 MG tablet Take 400 mg by mouth daily.    . Multiple Vitamin (MULTIVITAMIN) capsule Take 1 capsule by mouth daily.    . simvastatin (ZOCOR) 20 MG tablet Take 10 mg by mouth daily.      No current facility-administered medications for this visit.     Past Medical History  Diagnosis Date  . Headache(784.0)     sinus  . Breast cancer ([place]) 12/09/11    DCIS right breast, ER+  . Lupus anticoagulant positive   . Hypercholesterolemia   . Glaucoma, anatomical narrow angle     treated with laser surgery at [facility]  . Skin cancer     basal cell carcinoma; left side of mouth; removed bu Dr. Allyson Sabal in office  . History of radiation therapy 01/12/12 -02/11/12    right breast    ROS:   All systems reviewed and negative except as noted in the HPI.   Past Surgical History  Procedure Laterality Date  . Peripheral iridotomy  2009    bilateral  . Dilation and curettage of uterus    . Ganglion cyst excision      right  . Breast lumpectomy with needle localization and axillary sentinel lymph node bx  12/09/2011    Procedure: BREAST LUMPECTOMY WITH NEEDLE  LOCALIZATION AND AXILLARY SENTINEL LYMPH NODE BX;  Surgeon: Shann Medal, MD;  Location: [place];  Service: General;  Laterality: Right;  . Abdominal hysterectomy  1999    partial; left ovaries     Family History  Problem Relation Age of Onset  . Skin cancer Mother   . Lung cancer Father   . Lung cancer Maternal Uncle   . Lung cancer Paternal Uncle      Social History   Social History  . Marital Status: Married    Spouse Name: N/A  . Number of Children: N/A  . Years of Education: N/A   Occupational History  . Not on file.   Social History Main Topics  . Smoking status: Never Smoker   . Smokeless tobacco: Not on file  . Alcohol Use: 1.8 oz/week    3 Glasses of wine per week  . Drug Use: No  . Sexual Activity: Yes   Other Topics Concern  . Not on file   Social History Narrative     BP 126/80 mmHg  Pulse 85  Ht 5\' 8"  (1.727 m)  Wt 172 lb 12.8 oz (78.382 kg)  BMI 26.28 kg/m2  Physical Exam:  Well appearing 60 yo man, NAD HEENT: Unremarkable  Neck:  6 cm JVD, no thyromegally Lymphatics:  No adenopathy Back:  No CVA tenderness Lungs:  Clear with no wheezes HEART:  Regular rate rhythm, no murmurs, no rubs, no clicks Abd:  soft, positive bowel sounds, no organomegally, no rebound, no guarding Ext:  2 plus pulses, no edema, no cyanosis, no clubbing Skin:  No rashes no nodules Neuro:  CN II through XII intact, motor grossly intact  EKG - nsr   Assess/Plan: 1. Palpitations - the etiology of her symptoms is unclear. I have asked the patient to undergo 2 D echo and wear a cardiac monitor. Because her symptoms can be intermittent would have her wear a 30 day monitor which she could stop depending on her rhythm diagnosis. 2. Possible syncope - the etiology is unclear. She is low risk for any malignant causes but will risk stratify with an echo. 3. Dyslipidemia - she will remain on a statin  Mikle Bosworth.D.

## 2015-08-06 ENCOUNTER — Ambulatory Visit (INDEPENDENT_AMBULATORY_CARE_PROVIDER_SITE_OTHER): Payer: BLUE CROSS/BLUE SHIELD

## 2015-08-06 ENCOUNTER — Ambulatory Visit (HOSPITAL_COMMUNITY): Payer: BLUE CROSS/BLUE SHIELD | Attending: Cardiology

## 2015-08-06 ENCOUNTER — Other Ambulatory Visit: Payer: Self-pay

## 2015-08-06 DIAGNOSIS — R002 Palpitations: Secondary | ICD-10-CM

## 2015-08-08 ENCOUNTER — Telehealth: Payer: Self-pay | Admitting: Internal Medicine

## 2015-08-08 NOTE — Telephone Encounter (Signed)
Routed to [place] in monitors.

## 2015-08-08 NOTE — Telephone Encounter (Signed)
NEw Message  Pt call requeting to speak with RN about her device. Pt ask if she has any restrictions when exercising. Please call back to discuss

## 2015-08-08 NOTE — Telephone Encounter (Signed)
Spoke with patient and let her know she has no restrictions and may work out

## 2015-08-20 ENCOUNTER — Telehealth: Payer: Self-pay | Admitting: Internal Medicine

## 2015-08-20 NOTE — Telephone Encounter (Signed)
Returned call to patient, encouraged her to keep the full 30 days.  She feels she can tolerate for 1 more week but it is interfering with her life.  I explained to her that if she turned in and has symptoms her insurance likely will not pay for another monitor.  She understands and will try to keep one for 1 more week but does not feel she can do for 2 more.

## 2015-08-20 NOTE — Telephone Encounter (Signed)
New Message  Pt call requesting to speak with RN about coming off of the heart monitor. Pt states she is not seeing any issues and wanted to know if that would be ok with provider. Please call back to discuss

## 2015-09-03 ENCOUNTER — Encounter: Payer: Self-pay | Admitting: Internal Medicine

## 2015-09-14 ENCOUNTER — Ambulatory Visit (INDEPENDENT_AMBULATORY_CARE_PROVIDER_SITE_OTHER): Payer: BLUE CROSS/BLUE SHIELD | Admitting: Internal Medicine

## 2015-09-14 ENCOUNTER — Encounter: Payer: Self-pay | Admitting: Internal Medicine

## 2015-09-14 VITALS — BP 126/70 | HR 84 | Ht 68.0 in | Wt 170.0 lb

## 2015-09-14 DIAGNOSIS — R002 Palpitations: Secondary | ICD-10-CM

## 2015-09-14 NOTE — Progress Notes (Signed)
HPI [name] returns today for ongoing evaluation of palpitations and unexplained falls. She is a pleasant middle aged woman with a h/o palpitations dating back several months. The episodes last a few minutes. She has not had any sustained heart racing. She gives a h/o 2 episodes of finding herself on the ground. She is not sure if she passed out or fell but did not know why she fell. No h/o seizure activity or tongue biting or loss of continence. When I last saw her we had her undergo a 2D echo and wear a monitor for a month. She had no arrhythmias and her EF is normal with no valve problems.  No Known Allergies   Current Outpatient Prescriptions  Medication Sig Dispense Refill  . aspirin EC 81 MG tablet Take 81 mg by mouth 3 (three) times a week.     . Cholecalciferol (VITAMIN D-3 PO) Take 2,000 Units by mouth daily.    . Loratadine (CLARITIN REDITABS) 5 MG TBDP Take 10 mg by mouth daily as needed. For allergies    . magnesium oxide (MAG-OX) 400 MG tablet Take 400 mg by mouth daily.    . Multiple Vitamin (MULTIVITAMIN) capsule Take 1 capsule by mouth daily.    . simvastatin (ZOCOR) 10 MG tablet Take 10 mg by mouth daily.     No current facility-administered medications for this visit.      Past Medical History:  Diagnosis Date  . 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 Glaucoma, anatomical narrow angle    treated with laser surgery at [facility]  . Headache(784.0)    sinus  . History of radiation therapy 01/12/12 -02/11/12   right breast  . Hypercholesterolemia   . Lupus anticoagulant positive   . Skin cancer    basal cell carcinoma; left side of mouth; removed bu Dr. Allyson Sabal in office    ROS:   All systems reviewed and negative except as noted in the HPI.   Past Surgical History:  Procedure Laterality Date  . ABDOMINAL HYSTERECTOMY  1999   partial; left ovaries  . BREAST LUMPECTOMY WITH NEEDLE LOCALIZATION AND AXILLARY SENTINEL LYMPH NODE BX   12/09/2011   Procedure: BREAST LUMPECTOMY WITH NEEDLE LOCALIZATION AND AXILLARY SENTINEL LYMPH NODE BX;  Surgeon: Shann Medal, MD;  Location: [place];  Service: General;  Laterality: Right;  . DILATION AND CURETTAGE OF UTERUS    . GANGLION CYST EXCISION     right  . PERIPHERAL IRIDOTOMY  2009   bilateral     Family History  Problem Relation Age of Onset  . Skin cancer Mother   . Lung cancer Father   . Lung cancer Maternal Uncle   . Lung cancer Paternal Uncle      Social History   Social History  . Marital status: Married    Spouse name: N/A  . Number of children: N/A  . Years of education: N/A   Occupational History  . Not on file.   Social History Main Topics  . Smoking status: Never Smoker  . Smokeless tobacco: Never Used  . Alcohol use 1.8 oz/week    3 Glasses of wine per week  . Drug use: No  . Sexual activity: Yes   Other Topics Concern  . Not on file   Social History Narrative  . No narrative on file     BP 126/70   Pulse 84   Ht 5\' 8"  (1.727 m)  Wt 170 lb (77.1 kg)   BMI 25.85 kg/m   Physical Exam:  Well appearing 60 yo man, NAD HEENT: Unremarkable Neck:  6 cm JVD, no thyromegally Lymphatics:  No adenopathy Back:  No CVA tenderness Lungs:  Clear with no wheezes HEART:  Regular rate rhythm, no murmurs, no rubs, no clicks Abd:  soft, positive bowel sounds, no organomegally, no rebound, no guarding Ext:  2 plus pulses, no edema, no cyanosis, no clubbing Skin:  No rashes no nodules Neuro:  CN II through XII intact, motor grossly intact   Assess/Plan: 1. Palpitations - the etiology of her symptoms is unclear. She had no arrhythmias when she wore her cardiac monitor. She will undergo watchful waiting. 2. Possible syncope vs falls - the etiology is unclear. She has had no recurrent episodes and her heart appears normal. She will undergo watchful waiting. No driving restrictions at this point. 3. Dyslipidemia - she will remain on a statin 4.  Disp. - she will followup as needed.  Mikle Bosworth.D.

## 2015-09-14 NOTE — Patient Instructions (Signed)
Medication Instructions:  Your physician recommends that you continue on your current medications as directed. Please refer to the Current Medication list given to you today.   Labwork: None Ordered   Testing/Procedures: None Ordered   Follow-Up: Your physician recommends that you schedule a follow-up appointment in: as needed with Dr. Lovena Le    If you need a refill on your cardiac medications before your next appointment, please call your pharmacy.   Thank you for choosing [facility]! Christen Bame, RN [phone]

## 2015-10-31 ENCOUNTER — Other Ambulatory Visit: Payer: Self-pay | Admitting: Surgery

## 2015-10-31 DIAGNOSIS — Z853 Personal history of malignant neoplasm of breast: Secondary | ICD-10-CM

## 2015-11-26 ENCOUNTER — Ambulatory Visit
Admission: RE | Admit: 2015-11-26 | Discharge: 2015-11-26 | Disposition: A | Discharge status: Home / Self Care | Payer: BLUE CROSS/BLUE SHIELD | Source: Ambulatory Visit | Attending: Surgery | Admitting: Surgery

## 2015-11-26 DIAGNOSIS — Z853 Personal history of malignant neoplasm of breast: Secondary | ICD-10-CM

## 2016-02-02 ENCOUNTER — Ambulatory Visit (INDEPENDENT_AMBULATORY_CARE_PROVIDER_SITE_OTHER): Payer: BLUE CROSS/BLUE SHIELD | Admitting: Physician Assistant

## 2016-02-02 VITALS — BP 118/70 | HR 94 | Temp 97.9°F | Ht 68.0 in | Wt 170.0 lb

## 2016-02-02 DIAGNOSIS — J019 Acute sinusitis, unspecified: Secondary | ICD-10-CM | POA: Diagnosis not present

## 2016-02-02 MED ORDER — AMOXICILLIN-POT CLAVULANATE 875-125 MG PO TABS: 1.0000 | ORAL_TABLET | Freq: Two times a day (BID) | ORAL | 0 refills | Status: AC

## 2016-02-02 MED ORDER — GUAIFENESIN ER 1200 MG PO TB12: 1.0000 | ORAL_TABLET | Freq: Two times a day (BID) | ORAL | 1 refills | Status: DC | PRN

## 2016-02-02 NOTE — Progress Notes (Signed)
[facility] [address] [place] [address] [phone]  Date:  02/02/2016   Name:  Kristin Mills   DOB:  1955-03-07   MRN:  [id number]  PCP:  Jani Gravel, MD    History of Present Illness:  Kristin Mills is a 61 y.o. female patient who presents to [facility] for cc of sinus pressure.  Patient reports 2 weeks of progressively worsening nasal congestion. Patient reports some sinus pain within her cheeks and her forehead. There is not to have the mucus. The pain is more at the nasal branch and in between her eyes. She has no pain with moving her eyes or vision changes. There is some fatigue and feelings of malaise however no fever.   Patient Active Problem List   Diagnosis Date Noted  . History of radiation therapy   . 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 Glaucoma, anatomical narrow angle    treated with laser surgery at [facility]  . Headache(784.0)    sinus  . History of radiation therapy 01/12/12 -02/11/12   right breast  . Hypercholesterolemia   . Lupus anticoagulant positive   . Skin cancer    basal cell carcinoma; left side of mouth; removed bu Dr. Allyson Sabal in office    Past Surgical History:  Procedure Laterality Date  . ABDOMINAL HYSTERECTOMY  1999   partial; left ovaries  . BREAST LUMPECTOMY WITH NEEDLE LOCALIZATION AND AXILLARY SENTINEL LYMPH NODE BX  12/09/2011   Procedure: BREAST LUMPECTOMY WITH NEEDLE LOCALIZATION AND AXILLARY SENTINEL LYMPH NODE BX;  Surgeon: Shann Medal, MD;  Location: [place];  Service: General;  Laterality: Right;  . BREAST SURGERY Right 2013  . DILATION AND CURETTAGE OF UTERUS    . EYE SURGERY     laser over 10 years both eyes  . GANGLION CYST EXCISION     right  . PERIPHERAL IRIDOTOMY  2009   bilateral    Social History  Substance Use Topics  . Smoking status: Never Smoker  .  Smokeless tobacco: Never Used  . Alcohol use 1.8 oz/week    3 Glasses of wine per week    Family History  Problem Relation Age of Onset  . Skin cancer Mother   . Lung cancer Father   . Lung cancer Maternal Uncle   . Lung cancer Paternal Uncle     No Known Allergies  Medication list has been reviewed and updated.  Current Outpatient Prescriptions on File Prior to Visit  Medication Sig Dispense Refill  . aspirin EC 81 MG tablet Take 81 mg by mouth 3 (three) times a week.     . Cholecalciferol (VITAMIN D-3 PO) Take 2,000 Units by mouth daily.    . Loratadine (CLARITIN REDITABS) 5 MG TBDP Take 10 mg by mouth daily as needed. For allergies    . magnesium oxide (MAG-OX) 400 MG tablet Take 400 mg by mouth daily.    . Multiple Vitamin (MULTIVITAMIN) capsule Take 1 capsule by mouth daily.    . simvastatin (ZOCOR) 10 MG tablet Take 10 mg by mouth daily.     No current facility-administered medications on file prior to visit.     ROS ROS otherwise unremarkable unless listed above.  Physical Examination: BP 118/70 (BP Location: Left Arm, Patient Position: Sitting, Cuff Size: Small)   Pulse 94  Temp 97.9 F (36.6 C) (Oral)   Ht 5\' 8"  (1.727 m)   Wt 170 lb (77.1 kg)   SpO2 98%   BMI 25.85 kg/m  Ideal Body Weight: Weight in (lb) to have BMI = 25: 164.1  Physical Exam  Constitutional: She is oriented to person, place, and time. She appears well-developed and well-nourished. No distress.  HENT:  Head: Normocephalic and atraumatic.  Right Ear: Tympanic membrane, external ear and ear canal normal.  Left Ear: Tympanic membrane, external ear and ear canal normal.  Nose: Mucosal edema and rhinorrhea present. Right sinus exhibits frontal sinus tenderness. Right sinus exhibits no maxillary sinus tenderness. Left sinus exhibits frontal sinus tenderness. Left sinus exhibits no maxillary sinus tenderness.  Mouth/Throat: No uvula swelling. No oropharyngeal exudate, posterior oropharyngeal  edema or posterior oropharyngeal erythema.  Eyes: Conjunctivae and EOM are normal. Pupils are equal, round, and reactive to light.  Cardiovascular: Normal rate and regular rhythm.  Exam reveals no gallop, no distant heart sounds and no friction rub.   No murmur heard. Pulmonary/Chest: Effort normal. No respiratory distress. She has no decreased breath sounds. She has no wheezes. She has no rhonchi.  Lymphadenopathy:       Head (right side): No submandibular, no tonsillar, no preauricular and no posterior auricular adenopathy present.       Head (left side): No submandibular, no tonsillar, no preauricular and no posterior auricular adenopathy present.  Neurological: She is alert and oriented to person, place, and time.  Skin: She is not diaphoretic.  Psychiatric: She has a normal mood and affect. Her behavior is normal.     Assessment and Plan: Kristin Mills is a 61 y.o. female who is here today for chief complaint of sinus pressure and congestion. We will proceed to treat for bacterial etiology. Advised Mucinex. Return to clinic as needed. Subacute sinusitis, unspecified location - Plan: Guaifenesin (MUCINEX MAXIMUM STRENGTH) 1200 MG TB12, amoxicillin-clavulanate (AUGMENTIN) 875-125 MG tablet  Ivar Drape, PA-C [facility] [place] 1/21/20185:58 PM

## 2016-02-02 NOTE — Progress Notes (Signed)
[facility] [address] [place] [address] [phone]  Date:  02/02/2016   Name:  Kristin Mills   DOB:  1955-03-31   MRN:  [id number]  PCP:  Jani Gravel, MD    History of Present Illness:  Kristin Mills is a 61 y.o. female patient who presents to [facility]    2 weeks of headache, worsening head pain.  moreso on the right cheek bone to the right teeth.  Ears feel full will pop.  Thick clear mucus, and at times blood in the mucus.  No fever.  Minimal coughing.  Runny nose and congestion in the nostrils.   mucinex daytime and nighttime for over 1 week, she has been out of town.     Patient Active Problem List   Diagnosis Date Noted  . History of radiation therapy   . 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 Glaucoma, anatomical narrow angle    treated with laser surgery at [facility]  . Headache(784.0)    sinus  . History of radiation therapy 01/12/12 -02/11/12   right breast  . Hypercholesterolemia   . Lupus anticoagulant positive   . Skin cancer    basal cell carcinoma; left side of mouth; removed bu Dr. Allyson Sabal in office    Past Surgical History:  Procedure Laterality Date  . ABDOMINAL HYSTERECTOMY  1999   partial; left ovaries  . BREAST LUMPECTOMY WITH NEEDLE LOCALIZATION AND AXILLARY SENTINEL LYMPH NODE BX  12/09/2011   Procedure: BREAST LUMPECTOMY WITH NEEDLE LOCALIZATION AND AXILLARY SENTINEL LYMPH NODE BX;  Surgeon: Shann Medal, MD;  Location: [place];  Service: General;  Laterality: Right;  . BREAST SURGERY Right 2013  . DILATION AND CURETTAGE OF UTERUS    . EYE SURGERY     laser over 10 years both eyes  . GANGLION CYST EXCISION     right  . PERIPHERAL IRIDOTOMY  2009   bilateral    Social History  Substance Use Topics  . Smoking status: Never Smoker  . Smokeless tobacco: Never Used  . Alcohol use 1.8  oz/week    3 Glasses of wine per week    Family History  Problem Relation Age of Onset  . Skin cancer Mother   . Lung cancer Father   . Lung cancer Maternal Uncle   . Lung cancer Paternal Uncle     No Known Allergies  Medication list has been reviewed and updated.  Current Outpatient Prescriptions on File Prior to Visit  Medication Sig Dispense Refill  . aspirin EC 81 MG tablet Take 81 mg by mouth 3 (three) times a week.     . Cholecalciferol (VITAMIN D-3 PO) Take 2,000 Units by mouth daily.    . Loratadine (CLARITIN REDITABS) 5 MG TBDP Take 10 mg by mouth daily as needed. For allergies    . magnesium oxide (MAG-OX) 400 MG tablet Take 400 mg by mouth daily.    . Multiple Vitamin (MULTIVITAMIN) capsule Take 1 capsule by mouth daily.    . simvastatin (ZOCOR) 10 MG tablet Take 10 mg by mouth daily.     No current facility-administered medications on file prior to visit.     ROS   Physical Examination: BP 118/70 (BP Location: Left Arm, Patient Position: Sitting, Cuff Size: Small)   Pulse 94   Temp  97.9 F (36.6 C) (Oral)   Ht 5\' 8"  (1.727 m)   Wt 170 lb (77.1 kg)   SpO2 98%   BMI 25.85 kg/m  Ideal Body Weight: Weight in (lb) to have BMI = 25: 164.1  Physical Exam   Assessment and Plan: Kristin Mills is a 61 y.o. female who is here today  There are no diagnoses linked to this encounter.  Ivar Drape, PA-C [facility] 02/02/2016 9:03 AM

## 2016-02-02 NOTE — Patient Instructions (Addendum)
Please continue to hydrate well with 64 oz of water per day. I would like you to take the mucinex 1200mg  every 12 hours.   Continue the flonase as prescribed. Continue the claritin as well.   Sinusitis, Adult Sinusitis is soreness and inflammation of your sinuses. Sinuses are hollow spaces in the bones around your face. Your sinuses are located:  Around your eyes.  In the middle of your forehead.  Behind your nose.  In your cheekbones. Your sinuses and nasal passages are lined with a stringy fluid (mucus). Mucus normally drains out of your sinuses. When your nasal tissues become inflamed or swollen, the mucus can become trapped or blocked so air cannot flow through your sinuses. This allows bacteria, viruses, and funguses to grow, which leads to infection. Sinusitis can develop quickly and last for 7?10 days (acute) or for more than 12 weeks (chronic). Sinusitis often develops after a cold. What are the causes? This condition is caused by anything that creates swelling in the sinuses or stops mucus from draining, including:  Allergies.  Asthma.  Bacterial or viral infection.  Abnormally shaped bones between the nasal passages.  Nasal growths that contain mucus (nasal polyps).  Narrow sinus openings.  Pollutants, such as chemicals or irritants in the air.  A foreign object stuck in the nose.  A fungal infection. This is rare. What increases the risk? The following factors may make you more likely to develop this condition:  Having allergies or asthma.  Having had a recent cold or respiratory tract infection.  Having structural deformities or blockages in your nose or sinuses.  Having a weak immune system.  Doing a lot of swimming or diving.  Overusing nasal sprays.  Smoking. What are the signs or symptoms? The main symptoms of this condition are pain and a feeling of pressure around the affected sinuses. Other symptoms include:  Upper  toothache.  Earache.  Headache.  Bad breath.  Decreased sense of smell and taste.  A cough that may get worse at night.  Fatigue.  Fever.  Thick drainage from your nose. The drainage is often green and it may contain pus (purulent).  Stuffy nose or congestion.  Postnasal drip. This is when extra mucus collects in the throat or back of the nose.  Swelling and warmth over the affected sinuses.  Sore throat.  Sensitivity to light. How is this diagnosed? This condition is diagnosed based on symptoms, a medical history, and a physical exam. To find out if your condition is acute or chronic, your health care provider may:  Look in your nose for signs of nasal polyps.  Tap over the affected sinus to check for signs of infection.  View the inside of your sinuses using an imaging device that has a light attached (endoscope). If your health care provider suspects that you have chronic sinusitis, you may also:  Be tested for allergies.  Have a sample of mucus taken from your nose (nasal culture) and checked for bacteria.  Have a mucus sample examined to see if your sinusitis is related to an allergy. If your sinusitis does not respond to treatment and it lasts longer than 8 weeks, you may have an MRI or CT scan to check your sinuses. These scans also help to determine how severe your infection is. In rare cases, a bone biopsy may be done to rule out more serious types of fungal sinus disease. How is this treated? Treatment for sinusitis depends on the cause and whether your  condition is chronic or acute. If a virus is causing your sinusitis, your symptoms will go away on their own within 10 days. You may be given medicines to relieve your symptoms, including:  Topical nasal decongestants. They shrink swollen nasal passages and let mucus drain from your sinuses.  Antihistamines. These drugs block inflammation that is triggered by allergies. This can help to ease swelling in your  nose and sinuses.  Topical nasal corticosteroids. These are nasal sprays that ease inflammation and swelling in your nose and sinuses.  Nasal saline washes. These rinses can help to get rid of thick mucus in your nose. If your condition is caused by bacteria, you will be given an antibiotic medicine. If your condition is caused by a fungus, you will be given an antifungal medicine. Surgery may be needed to correct underlying conditions, such as narrow nasal passages. Surgery may also be needed to remove polyps. Follow these instructions at home: Medicines  Take, use, or apply over-the-counter and prescription medicines only as told by your health care provider. These may include nasal sprays.  If you were prescribed an antibiotic medicine, take it as told by your health care provider. Do not stop taking the antibiotic even if you start to feel better. Hydrate and Humidify  Drink enough water to keep your urine clear or pale yellow. Staying hydrated will help to thin your mucus.  Use a cool mist humidifier to keep the humidity level in your home above 50%.  Inhale steam for 10-15 minutes, 3-4 times a day or as told by your health care provider. You can do this in the bathroom while a hot shower is running.  Limit your exposure to cool or dry air. Rest  Rest as much as possible.  Sleep with your head raised (elevated).  Make sure to get enough sleep each night. General instructions  Apply a warm, moist washcloth to your face 3-4 times a day or as told by your health care provider. This will help with discomfort.  Wash your hands often with soap and water to reduce your exposure to viruses and other germs. If soap and water are not available, use hand sanitizer.  Do not smoke. Avoid being around people who are smoking (secondhand smoke).  Keep all follow-up visits as told by your health care provider. This is important. Contact a health care provider if:  You have a  fever.  Your symptoms get worse.  Your symptoms do not improve within 10 days. Get help right away if:  You have a severe headache.  You have persistent vomiting.  You have pain or swelling around your face or eyes.  You have vision problems.  You develop confusion.  Your neck is stiff.  You have trouble breathing. This information is not intended to replace advice given to you by your health care provider. Make sure you discuss any questions you have with your health care provider. Document Released: 01/06/2005 Document Revised: 09/02/2015 Document Reviewed: 11/01/2014 Elsevier Interactive Patient Education  2017 [employer].    IF you received an x-ray today, you will receive an invoice from [facility]. Please contact [facility] at [phone] with questions or concerns regarding your invoice.   IF you received labwork today, you will receive an invoice from [facility]. Please contact [facility] at [phone] with questions or concerns regarding your invoice.   Our billing staff will not be able to assist you with questions regarding bills from these companies.  You will be contacted with  the lab results as soon as they are available. The fastest way to get your results is to activate your My Chart account. Instructions are located on the last page of this paperwork. If you have not heard from [place] regarding the results in 2 weeks, please contact this office.

## 2016-03-04 ENCOUNTER — Other Ambulatory Visit: Payer: Self-pay | Admitting: Dermatology

## 2016-04-14 ENCOUNTER — Telehealth: Payer: Self-pay | Admitting: Adult Health

## 2016-04-14 NOTE — Telephone Encounter (Signed)
Patient called cancel Survivorship appt. And did not want to r/s

## 2016-05-15 ENCOUNTER — Encounter: Payer: BLUE CROSS/BLUE SHIELD | Admitting: Nurse Practitioner

## 2016-05-15 ENCOUNTER — Encounter: Payer: BLUE CROSS/BLUE SHIELD | Admitting: Adult Health

## 2016-08-27 ENCOUNTER — Emergency Department (HOSPITAL_COMMUNITY)
Admission: EM | Admit: 2016-08-27 | Discharge: 2016-08-27 | Disposition: A | Discharge status: Home / Self Care | Payer: BLUE CROSS/BLUE SHIELD | Attending: Emergency Medicine | Admitting: Emergency Medicine

## 2016-08-27 ENCOUNTER — Encounter (HOSPITAL_COMMUNITY): Payer: Self-pay | Admitting: Emergency Medicine

## 2016-08-27 DIAGNOSIS — S61213A Laceration without foreign body of left middle finger without damage to nail, initial encounter: Secondary | ICD-10-CM

## 2016-08-27 DIAGNOSIS — Y93G1 Activity, food preparation and clean up: Secondary | ICD-10-CM | POA: Insufficient documentation

## 2016-08-27 DIAGNOSIS — Y999 Unspecified external cause status: Secondary | ICD-10-CM | POA: Insufficient documentation

## 2016-08-27 DIAGNOSIS — Y929 Unspecified place or not applicable: Secondary | ICD-10-CM | POA: Insufficient documentation

## 2016-08-27 DIAGNOSIS — Z79899 Other long term (current) drug therapy: Secondary | ICD-10-CM | POA: Diagnosis not present

## 2016-08-27 DIAGNOSIS — W260XXA Contact with knife, initial encounter: Secondary | ICD-10-CM | POA: Insufficient documentation

## 2016-08-27 DIAGNOSIS — Z7982 Long term (current) use of aspirin: Secondary | ICD-10-CM | POA: Insufficient documentation

## 2016-08-27 MED ORDER — TETANUS-DIPHTH-ACELL PERTUSSIS 5-2.5-18.5 LF-MCG/0.5 IM SUSP
0.5000 mL | Freq: Once | INTRAMUSCULAR | Status: AC
  Administered 2016-08-27: 0.5 mL via INTRAMUSCULAR
  Filled 2016-08-27: qty 0.5

## 2016-08-27 MED ORDER — LIDOCAINE HCL (PF) 1 % IJ SOLN
5.0000 mL | Freq: Once | INTRAMUSCULAR | Status: AC
  Administered 2016-08-27: 5 mL

## 2016-08-27 NOTE — ED Provider Notes (Signed)
[facility] DEPT Provider Note   CSN: [id number] Arrival date & time: 08/27/16  1900     History   Chief Complaint Chief Complaint  Patient presents with  . Laceration    HPI Kristin Mills is a 61 y.o.right hand dominant female who presents to the emergency department from urgent care after slicing her left middle finger pad on a knife at approximately 5:30 PM. The patient was cooking dinner and attempting to cut an avocado when the knife slipped and cut her left middle finger at the pad of the finger. Bleeding is not yet controlled. She notes mild pain with this. She has not taken anything for this prior to arrival. She denies numbness, tingling. No use of blood thinners. Unsure of last tetanus shot.   HPI  Past Medical History:  Diagnosis Date  . Breast cancer ([place]) 12/09/11   DCIS right breast, ER+  . Glaucoma, anatomical narrow angle    treated with laser surgery at [facility]  . Headache(784.0)    sinus  . History of radiation therapy 01/12/12 -02/11/12   right breast  . Hypercholesterolemia   . Lupus anticoagulant positive   . Skin cancer    basal cell carcinoma; left side of mouth; removed bu Dr. Allyson Sabal in office    Patient Active Problem List   Diagnosis Date Noted  . History of radiation therapy   . 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[place]) 11/11/2011    Past Surgical History:  Procedure Laterality Date  . ABDOMINAL HYSTERECTOMY  1999   partial; left ovaries  . BREAST LUMPECTOMY WITH NEEDLE LOCALIZATION AND AXILLARY SENTINEL LYMPH NODE BX  12/09/2011   Procedure: BREAST LUMPECTOMY WITH NEEDLE LOCALIZATION AND AXILLARY SENTINEL LYMPH NODE BX;  Surgeon: Shann Medal, MD;  Location: [facility];  Service: General;  Laterality: Right;  . BREAST SURGERY Right 2013  . DILATION AND CURETTAGE OF UTERUS    . EYE SURGERY     laser over 10 years both eyes  . GANGLION CYST EXCISION     right  . PERIPHERAL IRIDOTOMY  2009   bilateral    OB  History    No data available       Home Medications    Prior to Admission medications   Medication Sig Start Date End Date Taking? Authorizing Provider  aspirin EC 81 MG tablet Take 81 mg by mouth daily.    Yes [provider]  Cholecalciferol (VITAMIN D-3 PO) Take 2,000 Units by mouth daily.   Yes [provider]  Loratadine (CLARITIN REDITABS) 5 MG TBDP Take 10 mg by mouth daily as needed (allergies). For allergies    Yes [provider]  magnesium oxide (MAG-OX) 400 MG tablet Take 400 mg by mouth daily.   Yes [provider]  Multiple Vitamin (MULTIVITAMIN) capsule Take 1 capsule by mouth daily.   Yes [provider]  simvastatin (ZOCOR) 10 MG tablet Take 10 mg by mouth daily.   Yes [provider]  Guaifenesin (MUCINEX MAXIMUM STRENGTH) 1200 MG TB12 Take 1 tablet (1,200 mg total) by mouth every 12 (twelve) hours as needed. Patient not taking: Reported on 08/27/2016 02/02/16   Joretta Bachelor, PA    Family History Family History  Problem Relation Age of Onset  . Skin cancer Mother   . Lung cancer Father   . Lung cancer Maternal Uncle   . Lung cancer Paternal Uncle     Social History Social History  Substance Use  Topics  . Smoking status: Never Smoker  . Smokeless tobacco: Never Used  . Alcohol use 1.8 oz/week    3 Glasses of wine per week     Allergies   Patient has no known allergies.   Review of Systems Review of Systems  Musculoskeletal: Negative for joint swelling.  Skin: Positive for wound.  Neurological: Negative for weakness and numbness.     Physical Exam Updated Vital Signs BP 136/87 (BP Location: Right Arm)   Pulse 85   Temp 98.6 F (37 C) (Oral)   Resp 18   Ht 5\' 8"  (1.727 m)   Wt 78.9 kg (174 lb)   SpO2 100%   BMI 26.46 kg/m   Physical Exam  Constitutional: She appears well-developed and well-nourished.  HENT:  Head: Normocephalic and atraumatic.  Right Ear: External ear normal.    Left Ear: External ear normal.  Eyes: Conjunctivae are normal. Right eye exhibits no discharge. Left eye exhibits no discharge. No scleral icterus.  Cardiovascular:  Pulses:      Radial pulses are 2+ on the right side, and 2+ on the left side.  Pulmonary/Chest: Effort normal. No respiratory distress.  Musculoskeletal:  Left Hand: Left middle fingerpad with 1.5cm laceration. Does not involve nail. Bleeding minimal. No TTP over flexor sheath. Radial/Ulnar arteries 2+ with <2sec cap refill. SILT in M/U/R distributions. Dynamic 2-pt discrimination intact over median and ulnar nerve distributions on the ulnar/radial aspects of digits. Grip strength intact. Finger adduction/abduction intact with 5/5 strength. Full ROM to Flexion/Extension at wrist, MCP, PIP and DIP.  FDS/FDP intact to isolated flexion.   Neurological: She is alert.  Skin: Skin is warm and dry. Capillary refill takes less than 2 seconds. Laceration noted. No pallor.  Psychiatric: She has a normal mood and affect.  Nursing note and vitals reviewed.    ED Treatments / Results  Labs (all labs ordered are listed, but only abnormal results are displayed) Labs Reviewed - No data to display  EKG  EKG Interpretation None       Radiology No results found.  Procedures .Marland KitchenLaceration Repair Date/Time: 08/27/2016 9:23 PM Performed by: Jillyn Ledger Authorized by: Jillyn Ledger   Consent:    Consent obtained:  Verbal   Consent given by:  Patient   Risks discussed:  Infection, nerve damage, need for additional repair, pain, poor wound healing, vascular damage, retained foreign body, tendon damage and poor cosmetic result   Alternatives discussed:  No treatment Anesthesia (see MAR for exact dosages):    Anesthesia method:  None Laceration details:    Location:  Finger   Finger location:  L long finger   Length (cm):  1.5 Repair type:    Repair type:  Simple Pre-procedure details:    Preparation:  Patient was  prepped and draped in usual sterile fashion Exploration:    Hemostasis achieved with:  Direct pressure Treatment:    Area cleansed with:  Saline   Amount of cleaning:  Standard   Irrigation solution:  Sterile saline   Irrigation volume:  100   Irrigation method:  Syringe   Visualized foreign bodies/material removed: no   Skin repair:    Repair method:  Sutures   Suture size:  5-0   Suture material:  Prolene   Suture technique:  Simple interrupted   Number of sutures:  4 Approximation:    Approximation:  Close Post-procedure details:    Dressing:  Non-adherent dressing and antibiotic ointment   Patient tolerance of procedure:  Tolerated well, no immediate complications    (including critical care time)  Medications Ordered in ED Medications  Tdap (BOOSTRIX) injection 0.5 mL (0.5 mLs Intramuscular Given 08/27/16 2237)  lidocaine (PF) (XYLOCAINE) 1 % injection 5 mL (5 mLs Infiltration Given by Other 08/27/16 2045)     Initial Impression / Assessment and Plan / ED Course  I have reviewed the triage vital signs and the nursing notes.  Pertinent labs & imaging results that were available during my care of the patient were reviewed by me and considered in my medical decision making (see chart for details).     61 year old female with laceration to left middle fingerpad. Hand exam without evidence of tendon or nerve damage. Pressure irrigation performed. Wound explored and base of wound visualized in a bloodless field without evidence of foreign body.  Laceration occurred < 8 hours prior to repair which was well tolerated. Tdap updated.  Pt has no comorbidities to effect normal wound healing. Pt discharged without antibiotics.  Discussed suture home care with patient and answered questions. Patient to follow up with hand this week. Pt to follow-up for wound check and suture removal in 7 days; they are to return to the ED sooner for signs of infection. Pt is hemodynamically stable with no  complaints prior to dc.    Final Clinical Impressions(s) / ED Diagnoses   Final diagnoses:  Laceration of left middle finger without foreign body without damage to nail, initial encounter    New Prescriptions Discharge Medication List as of 08/27/2016 10:03 PM       Jillyn Ledger, PA-C 08/27/16 2330    Dorie Rank, MD 08/28/16 [phone]

## 2016-08-27 NOTE — ED Notes (Signed)
Xeroform dressing applied to wound. Wound wrapped in kerlex and coban. Patient educated on wound care and dressing care. Patient verbalizes understanding.

## 2016-08-27 NOTE — Discharge Instructions (Signed)
Follow up with your doctor, an urgent care, or this Emergency Department for removal of your stitches in 7 days. Do not submerge the stitches in water for the first 24 hours. Take your pain medication as prescribed. Do not operate heavy machinery while on pain medication.   If you were given pain medication that contains acetaminophen (Tylenol) such as Vicodin or Percocet, it is not reccommended that you use additional acetaminophen (Tylenol) while taking this medication.   TREATMENT  Keep the wound clean and dry for the next 24 hours and leave the dressing in place. You may shower after 24 hours. Do not soak the area for long periods of times as in a bath until the sutures are removed. After 24 hours you may remove the dressing and gently clean the laceration site with antibacterial soap and warm water 2 times a day. Pat dry with clean towel. Do not scrub. Once the wound has healed, scarring can be minimized by covering the wound with sunscreen during the day for 1 full year.  SEEK MEDICAL CARE IF:  You have redness, swelling, or increasing pain in the wound.  You see a red line that goes away from the wound.  You have yellowish-white fluid (pus) coming from the wound.  You have a fever.  You notice a bad smell coming from the wound or dressing.  Your wound breaks open before or after sutures have been removed.  You notice something coming out of the wound such as wood or glass.  Your wound is on your hand or foot and you cannot move a finger or toe.  Your pain is not controlled with prescribed medicine.   If you did not receive a tetanus shot today because you thought you were up to date, but did not recall when your last one was given, make sure to check with your primary caregiver to determine if you need one.

## 2016-08-27 NOTE — ED Triage Notes (Signed)
Patient was cooking and using a knife in the kitchen. The knife slipped and cut her middle finger. Patient is bleeding and cut is deep.

## 2016-08-27 NOTE — ED Notes (Signed)
Discharge instructions reviewed with patient. Patient verbalizes understanding. VSS.   

## 2016-09-09 ENCOUNTER — Other Ambulatory Visit: Payer: Self-pay | Admitting: Gynecology

## 2016-09-09 DIAGNOSIS — N63 Unspecified lump in unspecified breast: Secondary | ICD-10-CM

## 2016-09-16 ENCOUNTER — Ambulatory Visit
Admission: RE | Admit: 2016-09-16 | Discharge: 2016-09-16 | Disposition: A | Discharge status: Home / Self Care | Payer: BLUE CROSS/BLUE SHIELD | Source: Ambulatory Visit | Attending: Gynecology | Admitting: Gynecology

## 2016-09-16 DIAGNOSIS — N63 Unspecified lump in unspecified breast: Secondary | ICD-10-CM

## 2016-11-14 ENCOUNTER — Other Ambulatory Visit: Payer: Self-pay | Admitting: Internal Medicine

## 2016-11-14 ENCOUNTER — Other Ambulatory Visit: Payer: Self-pay | Admitting: Gynecology

## 2016-11-14 DIAGNOSIS — Z853 Personal history of malignant neoplasm of breast: Secondary | ICD-10-CM

## 2016-11-26 ENCOUNTER — Ambulatory Visit
Admission: RE | Admit: 2016-11-26 | Discharge: 2016-11-26 | Disposition: A | Discharge status: Home / Self Care | Payer: BLUE CROSS/BLUE SHIELD | Source: Ambulatory Visit | Attending: Gynecology | Admitting: Gynecology

## 2016-11-26 DIAGNOSIS — Z853 Personal history of malignant neoplasm of breast: Secondary | ICD-10-CM

## 2016-11-26 HISTORY — DX: Personal history of irradiation: Z92.3

## 2017-10-14 ENCOUNTER — Other Ambulatory Visit: Payer: Self-pay | Admitting: Gynecology

## 2017-10-14 DIAGNOSIS — Z1231 Encounter for screening mammogram for malignant neoplasm of breast: Secondary | ICD-10-CM

## 2017-11-30 ENCOUNTER — Ambulatory Visit
Admission: RE | Admit: 2017-11-30 | Discharge: 2017-11-30 | Disposition: A | Discharge status: Home / Self Care | Payer: BLUE CROSS/BLUE SHIELD | Source: Ambulatory Visit | Attending: Gynecology | Admitting: Gynecology

## 2017-11-30 DIAGNOSIS — Z1231 Encounter for screening mammogram for malignant neoplasm of breast: Secondary | ICD-10-CM

## 2018-04-06 DIAGNOSIS — E785 Hyperlipidemia, unspecified: Secondary | ICD-10-CM | POA: Diagnosis not present

## 2018-04-06 DIAGNOSIS — R002 Palpitations: Secondary | ICD-10-CM | POA: Diagnosis not present

## 2018-05-27 DIAGNOSIS — Z0000 Encounter for general adult medical examination without abnormal findings: Secondary | ICD-10-CM | POA: Diagnosis not present

## 2018-06-02 DIAGNOSIS — D72819 Decreased white blood cell count, unspecified: Secondary | ICD-10-CM | POA: Diagnosis not present

## 2018-06-02 DIAGNOSIS — R030 Elevated blood-pressure reading, without diagnosis of hypertension: Secondary | ICD-10-CM | POA: Diagnosis not present

## 2018-06-02 DIAGNOSIS — E7800 Pure hypercholesterolemia, unspecified: Secondary | ICD-10-CM | POA: Diagnosis not present

## 2018-06-02 DIAGNOSIS — Z853 Personal history of malignant neoplasm of breast: Secondary | ICD-10-CM | POA: Diagnosis not present

## 2018-06-02 DIAGNOSIS — Z0000 Encounter for general adult medical examination without abnormal findings: Secondary | ICD-10-CM | POA: Diagnosis not present

## 2018-06-02 DIAGNOSIS — M8580 Other specified disorders of bone density and structure, unspecified site: Secondary | ICD-10-CM | POA: Diagnosis not present

## 2018-06-09 DIAGNOSIS — Z0000 Encounter for general adult medical examination without abnormal findings: Secondary | ICD-10-CM | POA: Diagnosis not present

## 2018-06-09 DIAGNOSIS — E7800 Pure hypercholesterolemia, unspecified: Secondary | ICD-10-CM | POA: Diagnosis not present

## 2018-06-09 DIAGNOSIS — R760 Raised antibody titer: Secondary | ICD-10-CM | POA: Diagnosis not present

## 2018-06-09 DIAGNOSIS — I10 Essential (primary) hypertension: Secondary | ICD-10-CM | POA: Diagnosis not present

## 2018-06-09 DIAGNOSIS — Z853 Personal history of malignant neoplasm of breast: Secondary | ICD-10-CM | POA: Diagnosis not present

## 2018-07-06 DIAGNOSIS — H2513 Age-related nuclear cataract, bilateral: Secondary | ICD-10-CM | POA: Diagnosis not present

## 2018-08-31 DIAGNOSIS — S63635A Sprain of interphalangeal joint of left ring finger, initial encounter: Secondary | ICD-10-CM | POA: Diagnosis not present

## 2018-08-31 DIAGNOSIS — M79645 Pain in left finger(s): Secondary | ICD-10-CM | POA: Diagnosis not present

## 2018-08-31 DIAGNOSIS — S62645A Nondisplaced fracture of proximal phalanx of left ring finger, initial encounter for closed fracture: Secondary | ICD-10-CM | POA: Diagnosis not present

## 2018-09-02 DIAGNOSIS — R29898 Other symptoms and signs involving the musculoskeletal system: Secondary | ICD-10-CM | POA: Diagnosis not present

## 2018-09-02 DIAGNOSIS — S62645D Nondisplaced fracture of proximal phalanx of left ring finger, subsequent encounter for fracture with routine healing: Secondary | ICD-10-CM | POA: Diagnosis not present

## 2018-09-02 DIAGNOSIS — M79645 Pain in left finger(s): Secondary | ICD-10-CM | POA: Diagnosis not present

## 2018-09-02 DIAGNOSIS — M25642 Stiffness of left hand, not elsewhere classified: Secondary | ICD-10-CM | POA: Diagnosis not present

## 2018-09-02 DIAGNOSIS — S63635D Sprain of interphalangeal joint of left ring finger, subsequent encounter: Secondary | ICD-10-CM | POA: Diagnosis not present

## 2018-09-08 DIAGNOSIS — M79645 Pain in left finger(s): Secondary | ICD-10-CM | POA: Diagnosis not present

## 2018-09-08 DIAGNOSIS — M25642 Stiffness of left hand, not elsewhere classified: Secondary | ICD-10-CM | POA: Diagnosis not present

## 2018-09-08 DIAGNOSIS — S62645D Nondisplaced fracture of proximal phalanx of left ring finger, subsequent encounter for fracture with routine healing: Secondary | ICD-10-CM | POA: Diagnosis not present

## 2018-09-08 DIAGNOSIS — S63635D Sprain of interphalangeal joint of left ring finger, subsequent encounter: Secondary | ICD-10-CM | POA: Diagnosis not present

## 2018-09-08 DIAGNOSIS — R29898 Other symptoms and signs involving the musculoskeletal system: Secondary | ICD-10-CM | POA: Diagnosis not present

## 2018-09-16 DIAGNOSIS — S63635D Sprain of interphalangeal joint of left ring finger, subsequent encounter: Secondary | ICD-10-CM | POA: Diagnosis not present

## 2018-09-16 DIAGNOSIS — M25642 Stiffness of left hand, not elsewhere classified: Secondary | ICD-10-CM | POA: Diagnosis not present

## 2018-09-16 DIAGNOSIS — M79645 Pain in left finger(s): Secondary | ICD-10-CM | POA: Diagnosis not present

## 2018-09-16 DIAGNOSIS — S62645D Nondisplaced fracture of proximal phalanx of left ring finger, subsequent encounter for fracture with routine healing: Secondary | ICD-10-CM | POA: Diagnosis not present

## 2018-09-16 DIAGNOSIS — R29898 Other symptoms and signs involving the musculoskeletal system: Secondary | ICD-10-CM | POA: Diagnosis not present

## 2018-09-21 DIAGNOSIS — M79645 Pain in left finger(s): Secondary | ICD-10-CM | POA: Diagnosis not present

## 2018-09-21 DIAGNOSIS — R29898 Other symptoms and signs involving the musculoskeletal system: Secondary | ICD-10-CM | POA: Diagnosis not present

## 2018-09-21 DIAGNOSIS — S63635D Sprain of interphalangeal joint of left ring finger, subsequent encounter: Secondary | ICD-10-CM | POA: Diagnosis not present

## 2018-09-21 DIAGNOSIS — S62645D Nondisplaced fracture of proximal phalanx of left ring finger, subsequent encounter for fracture with routine healing: Secondary | ICD-10-CM | POA: Diagnosis not present

## 2018-09-21 DIAGNOSIS — M25642 Stiffness of left hand, not elsewhere classified: Secondary | ICD-10-CM | POA: Diagnosis not present

## 2018-09-29 DIAGNOSIS — S63635D Sprain of interphalangeal joint of left ring finger, subsequent encounter: Secondary | ICD-10-CM | POA: Diagnosis not present

## 2018-09-29 DIAGNOSIS — M25642 Stiffness of left hand, not elsewhere classified: Secondary | ICD-10-CM | POA: Diagnosis not present

## 2018-09-29 DIAGNOSIS — S62645D Nondisplaced fracture of proximal phalanx of left ring finger, subsequent encounter for fracture with routine healing: Secondary | ICD-10-CM | POA: Diagnosis not present

## 2018-09-29 DIAGNOSIS — M79645 Pain in left finger(s): Secondary | ICD-10-CM | POA: Diagnosis not present

## 2018-09-29 DIAGNOSIS — R29898 Other symptoms and signs involving the musculoskeletal system: Secondary | ICD-10-CM | POA: Diagnosis not present

## 2018-10-07 DIAGNOSIS — S62645D Nondisplaced fracture of proximal phalanx of left ring finger, subsequent encounter for fracture with routine healing: Secondary | ICD-10-CM | POA: Diagnosis not present

## 2018-10-07 DIAGNOSIS — R29898 Other symptoms and signs involving the musculoskeletal system: Secondary | ICD-10-CM | POA: Diagnosis not present

## 2018-10-07 DIAGNOSIS — S63635D Sprain of interphalangeal joint of left ring finger, subsequent encounter: Secondary | ICD-10-CM | POA: Diagnosis not present

## 2018-10-07 DIAGNOSIS — M79645 Pain in left finger(s): Secondary | ICD-10-CM | POA: Diagnosis not present

## 2018-10-07 DIAGNOSIS — M25642 Stiffness of left hand, not elsewhere classified: Secondary | ICD-10-CM | POA: Diagnosis not present

## 2018-10-11 ENCOUNTER — Other Ambulatory Visit: Payer: Self-pay | Admitting: Gynecology

## 2018-10-11 DIAGNOSIS — Z1231 Encounter for screening mammogram for malignant neoplasm of breast: Secondary | ICD-10-CM

## 2018-10-14 DIAGNOSIS — S62645D Nondisplaced fracture of proximal phalanx of left ring finger, subsequent encounter for fracture with routine healing: Secondary | ICD-10-CM | POA: Diagnosis not present

## 2018-10-14 DIAGNOSIS — R29898 Other symptoms and signs involving the musculoskeletal system: Secondary | ICD-10-CM | POA: Diagnosis not present

## 2018-10-14 DIAGNOSIS — M25642 Stiffness of left hand, not elsewhere classified: Secondary | ICD-10-CM | POA: Diagnosis not present

## 2018-10-14 DIAGNOSIS — M79645 Pain in left finger(s): Secondary | ICD-10-CM | POA: Diagnosis not present

## 2018-10-14 DIAGNOSIS — S63635D Sprain of interphalangeal joint of left ring finger, subsequent encounter: Secondary | ICD-10-CM | POA: Diagnosis not present

## 2018-10-21 DIAGNOSIS — R29898 Other symptoms and signs involving the musculoskeletal system: Secondary | ICD-10-CM | POA: Diagnosis not present

## 2018-10-21 DIAGNOSIS — S62645D Nondisplaced fracture of proximal phalanx of left ring finger, subsequent encounter for fracture with routine healing: Secondary | ICD-10-CM | POA: Diagnosis not present

## 2018-10-21 DIAGNOSIS — S63635D Sprain of interphalangeal joint of left ring finger, subsequent encounter: Secondary | ICD-10-CM | POA: Diagnosis not present

## 2018-10-21 DIAGNOSIS — M25642 Stiffness of left hand, not elsewhere classified: Secondary | ICD-10-CM | POA: Diagnosis not present

## 2018-10-25 DIAGNOSIS — D229 Melanocytic nevi, unspecified: Secondary | ICD-10-CM | POA: Diagnosis not present

## 2018-10-25 DIAGNOSIS — L280 Lichen simplex chronicus: Secondary | ICD-10-CM | POA: Diagnosis not present

## 2018-10-25 DIAGNOSIS — D1801 Hemangioma of skin and subcutaneous tissue: Secondary | ICD-10-CM | POA: Diagnosis not present

## 2018-10-25 DIAGNOSIS — L814 Other melanin hyperpigmentation: Secondary | ICD-10-CM | POA: Diagnosis not present

## 2018-10-25 DIAGNOSIS — L821 Other seborrheic keratosis: Secondary | ICD-10-CM | POA: Diagnosis not present

## 2018-10-28 DIAGNOSIS — M79645 Pain in left finger(s): Secondary | ICD-10-CM | POA: Diagnosis not present

## 2018-10-28 DIAGNOSIS — S63635D Sprain of interphalangeal joint of left ring finger, subsequent encounter: Secondary | ICD-10-CM | POA: Diagnosis not present

## 2018-10-28 DIAGNOSIS — S62645D Nondisplaced fracture of proximal phalanx of left ring finger, subsequent encounter for fracture with routine healing: Secondary | ICD-10-CM | POA: Diagnosis not present

## 2018-10-28 DIAGNOSIS — M25642 Stiffness of left hand, not elsewhere classified: Secondary | ICD-10-CM | POA: Diagnosis not present

## 2018-10-28 DIAGNOSIS — R29898 Other symptoms and signs involving the musculoskeletal system: Secondary | ICD-10-CM | POA: Diagnosis not present

## 2018-11-04 DIAGNOSIS — M25642 Stiffness of left hand, not elsewhere classified: Secondary | ICD-10-CM | POA: Diagnosis not present

## 2018-11-04 DIAGNOSIS — M79645 Pain in left finger(s): Secondary | ICD-10-CM | POA: Diagnosis not present

## 2018-11-04 DIAGNOSIS — S63635D Sprain of interphalangeal joint of left ring finger, subsequent encounter: Secondary | ICD-10-CM | POA: Diagnosis not present

## 2018-11-04 DIAGNOSIS — S62645D Nondisplaced fracture of proximal phalanx of left ring finger, subsequent encounter for fracture with routine healing: Secondary | ICD-10-CM | POA: Diagnosis not present

## 2018-11-04 DIAGNOSIS — R29898 Other symptoms and signs involving the musculoskeletal system: Secondary | ICD-10-CM | POA: Diagnosis not present

## 2018-11-10 DIAGNOSIS — S63635A Sprain of interphalangeal joint of left ring finger, initial encounter: Secondary | ICD-10-CM | POA: Diagnosis not present

## 2018-11-10 DIAGNOSIS — R29898 Other symptoms and signs involving the musculoskeletal system: Secondary | ICD-10-CM | POA: Diagnosis not present

## 2018-11-10 DIAGNOSIS — M79645 Pain in left finger(s): Secondary | ICD-10-CM | POA: Diagnosis not present

## 2018-11-10 DIAGNOSIS — S63635D Sprain of interphalangeal joint of left ring finger, subsequent encounter: Secondary | ICD-10-CM | POA: Diagnosis not present

## 2018-11-10 DIAGNOSIS — S62645D Nondisplaced fracture of proximal phalanx of left ring finger, subsequent encounter for fracture with routine healing: Secondary | ICD-10-CM | POA: Diagnosis not present

## 2018-11-10 DIAGNOSIS — M25642 Stiffness of left hand, not elsewhere classified: Secondary | ICD-10-CM | POA: Diagnosis not present

## 2018-11-23 ENCOUNTER — Other Ambulatory Visit: Payer: Self-pay

## 2018-11-23 DIAGNOSIS — Z20822 Contact with and (suspected) exposure to covid-19: Secondary | ICD-10-CM

## 2018-11-24 LAB — NOVEL CORONAVIRUS, NAA: SARS-CoV-2, NAA: DETECTED — AB

## 2018-12-02 ENCOUNTER — Ambulatory Visit: Payer: BLUE CROSS/BLUE SHIELD

## 2018-12-08 DIAGNOSIS — R29898 Other symptoms and signs involving the musculoskeletal system: Secondary | ICD-10-CM | POA: Diagnosis not present

## 2018-12-08 DIAGNOSIS — S63635D Sprain of interphalangeal joint of left ring finger, subsequent encounter: Secondary | ICD-10-CM | POA: Diagnosis not present

## 2018-12-08 DIAGNOSIS — M25642 Stiffness of left hand, not elsewhere classified: Secondary | ICD-10-CM | POA: Diagnosis not present

## 2018-12-08 DIAGNOSIS — S62645D Nondisplaced fracture of proximal phalanx of left ring finger, subsequent encounter for fracture with routine healing: Secondary | ICD-10-CM | POA: Diagnosis not present

## 2018-12-08 DIAGNOSIS — M79645 Pain in left finger(s): Secondary | ICD-10-CM | POA: Diagnosis not present

## 2018-12-14 DIAGNOSIS — M79645 Pain in left finger(s): Secondary | ICD-10-CM | POA: Diagnosis not present

## 2018-12-14 DIAGNOSIS — M25642 Stiffness of left hand, not elsewhere classified: Secondary | ICD-10-CM | POA: Diagnosis not present

## 2018-12-14 DIAGNOSIS — R29898 Other symptoms and signs involving the musculoskeletal system: Secondary | ICD-10-CM | POA: Diagnosis not present

## 2018-12-14 DIAGNOSIS — S62645D Nondisplaced fracture of proximal phalanx of left ring finger, subsequent encounter for fracture with routine healing: Secondary | ICD-10-CM | POA: Diagnosis not present

## 2018-12-14 DIAGNOSIS — S63635D Sprain of interphalangeal joint of left ring finger, subsequent encounter: Secondary | ICD-10-CM | POA: Diagnosis not present

## 2018-12-21 DIAGNOSIS — M25642 Stiffness of left hand, not elsewhere classified: Secondary | ICD-10-CM | POA: Diagnosis not present

## 2018-12-21 DIAGNOSIS — S63635A Sprain of interphalangeal joint of left ring finger, initial encounter: Secondary | ICD-10-CM | POA: Diagnosis not present

## 2018-12-22 DIAGNOSIS — M25642 Stiffness of left hand, not elsewhere classified: Secondary | ICD-10-CM | POA: Diagnosis not present

## 2018-12-22 DIAGNOSIS — S62645D Nondisplaced fracture of proximal phalanx of left ring finger, subsequent encounter for fracture with routine healing: Secondary | ICD-10-CM | POA: Diagnosis not present

## 2018-12-22 DIAGNOSIS — R29898 Other symptoms and signs involving the musculoskeletal system: Secondary | ICD-10-CM | POA: Diagnosis not present

## 2018-12-22 DIAGNOSIS — S63635D Sprain of interphalangeal joint of left ring finger, subsequent encounter: Secondary | ICD-10-CM | POA: Diagnosis not present

## 2018-12-22 DIAGNOSIS — M79645 Pain in left finger(s): Secondary | ICD-10-CM | POA: Diagnosis not present

## 2018-12-28 DIAGNOSIS — M25642 Stiffness of left hand, not elsewhere classified: Secondary | ICD-10-CM | POA: Diagnosis not present

## 2018-12-28 DIAGNOSIS — M79645 Pain in left finger(s): Secondary | ICD-10-CM | POA: Diagnosis not present

## 2018-12-28 DIAGNOSIS — S63635D Sprain of interphalangeal joint of left ring finger, subsequent encounter: Secondary | ICD-10-CM | POA: Diagnosis not present

## 2018-12-28 DIAGNOSIS — S62645D Nondisplaced fracture of proximal phalanx of left ring finger, subsequent encounter for fracture with routine healing: Secondary | ICD-10-CM | POA: Diagnosis not present

## 2018-12-28 DIAGNOSIS — R29898 Other symptoms and signs involving the musculoskeletal system: Secondary | ICD-10-CM | POA: Diagnosis not present

## 2019-01-06 DIAGNOSIS — R29898 Other symptoms and signs involving the musculoskeletal system: Secondary | ICD-10-CM | POA: Diagnosis not present

## 2019-01-06 DIAGNOSIS — M25642 Stiffness of left hand, not elsewhere classified: Secondary | ICD-10-CM | POA: Diagnosis not present

## 2019-01-06 DIAGNOSIS — M79645 Pain in left finger(s): Secondary | ICD-10-CM | POA: Diagnosis not present

## 2019-01-06 DIAGNOSIS — S62645D Nondisplaced fracture of proximal phalanx of left ring finger, subsequent encounter for fracture with routine healing: Secondary | ICD-10-CM | POA: Diagnosis not present

## 2019-01-06 DIAGNOSIS — S63635D Sprain of interphalangeal joint of left ring finger, subsequent encounter: Secondary | ICD-10-CM | POA: Diagnosis not present

## 2019-01-19 ENCOUNTER — Other Ambulatory Visit: Payer: Self-pay

## 2019-01-19 ENCOUNTER — Ambulatory Visit
Admission: RE | Admit: 2019-01-19 | Discharge: 2019-01-19 | Disposition: A | Discharge status: Home / Self Care | Payer: BLUE CROSS/BLUE SHIELD | Source: Ambulatory Visit | Attending: Gynecology | Admitting: Gynecology

## 2019-01-19 DIAGNOSIS — R29898 Other symptoms and signs involving the musculoskeletal system: Secondary | ICD-10-CM | POA: Diagnosis not present

## 2019-01-19 DIAGNOSIS — S63635D Sprain of interphalangeal joint of left ring finger, subsequent encounter: Secondary | ICD-10-CM | POA: Diagnosis not present

## 2019-01-19 DIAGNOSIS — S62645D Nondisplaced fracture of proximal phalanx of left ring finger, subsequent encounter for fracture with routine healing: Secondary | ICD-10-CM | POA: Diagnosis not present

## 2019-01-19 DIAGNOSIS — M79645 Pain in left finger(s): Secondary | ICD-10-CM | POA: Diagnosis not present

## 2019-01-19 DIAGNOSIS — Z1231 Encounter for screening mammogram for malignant neoplasm of breast: Secondary | ICD-10-CM | POA: Diagnosis not present

## 2019-01-19 DIAGNOSIS — M25642 Stiffness of left hand, not elsewhere classified: Secondary | ICD-10-CM | POA: Diagnosis not present

## 2019-02-03 DIAGNOSIS — R1310 Dysphagia, unspecified: Secondary | ICD-10-CM | POA: Diagnosis not present

## 2019-02-03 DIAGNOSIS — K219 Gastro-esophageal reflux disease without esophagitis: Secondary | ICD-10-CM | POA: Diagnosis not present

## 2019-03-06 ENCOUNTER — Ambulatory Visit: Payer: Medicare Other | Attending: Internal Medicine

## 2019-03-06 DIAGNOSIS — Z23 Encounter for immunization: Secondary | ICD-10-CM | POA: Insufficient documentation

## 2019-03-06 NOTE — Progress Notes (Signed)
   Covid-19 Vaccination Clinic  Name:  Kristin Mills    MRN: [id number] DOB: [birth year]  03/06/2019  [name] was observed post Covid-19 immunization for 15 minutes without incidence. She was provided with Vaccine Information Sheet and instruction to access the V-Safe system.   [name] was instructed to call 911 with any severe reactions post vaccine: Marland Kitchen Difficulty breathing  . Swelling of your face and throat  . A fast heartbeat  . A bad rash all over your body  . Dizziness and weakness    Immunizations Administered    Name Date Dose VIS Date Route   Pfizer COVID-19 Vaccine 03/06/2019  3:19 PM 0.3 mL 12/31/2018 Intramuscular   Manufacturer: [place]   Lot: [id number]   [place]: [id number]

## 2019-03-29 ENCOUNTER — Ambulatory Visit: Payer: Medicare Other | Attending: Internal Medicine

## 2019-03-29 DIAGNOSIS — Z23 Encounter for immunization: Secondary | ICD-10-CM

## 2019-03-29 NOTE — Progress Notes (Signed)
   Covid-19 Vaccination Clinic  Name:  Kristin Mills    MRN: [id number] DOB: [birth year]  03/29/2019  [name] was observed post Covid-19 immunization for 15 minutes without incident. She was provided with Vaccine Information Sheet and instruction to access the V-Safe system.   [name] was instructed to call 911 with any severe reactions post vaccine: Marland Kitchen Difficulty breathing  . Swelling of face and throat  . A fast heartbeat  . A bad rash all over body  . Dizziness and weakness   Immunizations Administered    Name Date Dose VIS Date Route   Pfizer COVID-19 Vaccine 03/29/2019  1:26 PM 0.3 mL 12/31/2018 Intramuscular   Manufacturer: [place]   Lot: [id number]   [place]: [id number]

## 2019-03-30 ENCOUNTER — Ambulatory Visit: Payer: Medicare Other

## 2019-04-12 DIAGNOSIS — M62830 Muscle spasm of back: Secondary | ICD-10-CM | POA: Diagnosis not present

## 2019-04-12 DIAGNOSIS — M9905 Segmental and somatic dysfunction of pelvic region: Secondary | ICD-10-CM | POA: Diagnosis not present

## 2019-04-12 DIAGNOSIS — M9902 Segmental and somatic dysfunction of thoracic region: Secondary | ICD-10-CM | POA: Diagnosis not present

## 2019-04-12 DIAGNOSIS — M9903 Segmental and somatic dysfunction of lumbar region: Secondary | ICD-10-CM | POA: Diagnosis not present

## 2019-04-20 DIAGNOSIS — M62830 Muscle spasm of back: Secondary | ICD-10-CM | POA: Diagnosis not present

## 2019-04-20 DIAGNOSIS — M9902 Segmental and somatic dysfunction of thoracic region: Secondary | ICD-10-CM | POA: Diagnosis not present

## 2019-04-20 DIAGNOSIS — M9903 Segmental and somatic dysfunction of lumbar region: Secondary | ICD-10-CM | POA: Diagnosis not present

## 2019-04-20 DIAGNOSIS — M9905 Segmental and somatic dysfunction of pelvic region: Secondary | ICD-10-CM | POA: Diagnosis not present

## 2019-05-21 DIAGNOSIS — I10 Essential (primary) hypertension: Secondary | ICD-10-CM | POA: Diagnosis present

## 2019-05-23 DIAGNOSIS — M62830 Muscle spasm of back: Secondary | ICD-10-CM | POA: Diagnosis not present

## 2019-05-23 DIAGNOSIS — M9905 Segmental and somatic dysfunction of pelvic region: Secondary | ICD-10-CM | POA: Diagnosis not present

## 2019-05-23 DIAGNOSIS — M9902 Segmental and somatic dysfunction of thoracic region: Secondary | ICD-10-CM | POA: Diagnosis not present

## 2019-05-23 DIAGNOSIS — M9903 Segmental and somatic dysfunction of lumbar region: Secondary | ICD-10-CM | POA: Diagnosis not present

## 2019-06-06 DIAGNOSIS — D72819 Decreased white blood cell count, unspecified: Secondary | ICD-10-CM | POA: Diagnosis not present

## 2019-06-06 DIAGNOSIS — Z0000 Encounter for general adult medical examination without abnormal findings: Secondary | ICD-10-CM | POA: Diagnosis not present

## 2019-06-06 DIAGNOSIS — R030 Elevated blood-pressure reading, without diagnosis of hypertension: Secondary | ICD-10-CM | POA: Diagnosis not present

## 2019-06-06 DIAGNOSIS — M8580 Other specified disorders of bone density and structure, unspecified site: Secondary | ICD-10-CM | POA: Diagnosis not present

## 2019-06-06 DIAGNOSIS — E7800 Pure hypercholesterolemia, unspecified: Secondary | ICD-10-CM | POA: Diagnosis not present

## 2019-06-07 DIAGNOSIS — Z853 Personal history of malignant neoplasm of breast: Secondary | ICD-10-CM | POA: Diagnosis not present

## 2019-06-09 DIAGNOSIS — M9902 Segmental and somatic dysfunction of thoracic region: Secondary | ICD-10-CM | POA: Diagnosis not present

## 2019-06-09 DIAGNOSIS — M9905 Segmental and somatic dysfunction of pelvic region: Secondary | ICD-10-CM | POA: Diagnosis not present

## 2019-06-09 DIAGNOSIS — M9903 Segmental and somatic dysfunction of lumbar region: Secondary | ICD-10-CM | POA: Diagnosis not present

## 2019-06-09 DIAGNOSIS — M62830 Muscle spasm of back: Secondary | ICD-10-CM | POA: Diagnosis not present

## 2019-06-10 DIAGNOSIS — M9905 Segmental and somatic dysfunction of pelvic region: Secondary | ICD-10-CM | POA: Diagnosis not present

## 2019-06-10 DIAGNOSIS — M62830 Muscle spasm of back: Secondary | ICD-10-CM | POA: Diagnosis not present

## 2019-06-10 DIAGNOSIS — M9902 Segmental and somatic dysfunction of thoracic region: Secondary | ICD-10-CM | POA: Diagnosis not present

## 2019-06-10 DIAGNOSIS — M9903 Segmental and somatic dysfunction of lumbar region: Secondary | ICD-10-CM | POA: Diagnosis not present

## 2019-06-13 DIAGNOSIS — M810 Age-related osteoporosis without current pathological fracture: Secondary | ICD-10-CM | POA: Diagnosis not present

## 2019-06-13 DIAGNOSIS — R609 Edema, unspecified: Secondary | ICD-10-CM | POA: Diagnosis not present

## 2019-06-13 DIAGNOSIS — Z0000 Encounter for general adult medical examination without abnormal findings: Secondary | ICD-10-CM | POA: Diagnosis not present

## 2019-06-14 DIAGNOSIS — M9903 Segmental and somatic dysfunction of lumbar region: Secondary | ICD-10-CM | POA: Diagnosis not present

## 2019-06-14 DIAGNOSIS — M9905 Segmental and somatic dysfunction of pelvic region: Secondary | ICD-10-CM | POA: Diagnosis not present

## 2019-06-14 DIAGNOSIS — M62830 Muscle spasm of back: Secondary | ICD-10-CM | POA: Diagnosis not present

## 2019-06-14 DIAGNOSIS — M9902 Segmental and somatic dysfunction of thoracic region: Secondary | ICD-10-CM | POA: Diagnosis not present

## 2019-06-27 DIAGNOSIS — M9903 Segmental and somatic dysfunction of lumbar region: Secondary | ICD-10-CM | POA: Diagnosis not present

## 2019-06-27 DIAGNOSIS — M9902 Segmental and somatic dysfunction of thoracic region: Secondary | ICD-10-CM | POA: Diagnosis not present

## 2019-06-27 DIAGNOSIS — M62830 Muscle spasm of back: Secondary | ICD-10-CM | POA: Diagnosis not present

## 2019-06-27 DIAGNOSIS — M9905 Segmental and somatic dysfunction of pelvic region: Secondary | ICD-10-CM | POA: Diagnosis not present

## 2019-07-06 DIAGNOSIS — H53143 Visual discomfort, bilateral: Secondary | ICD-10-CM | POA: Diagnosis not present

## 2019-10-19 ENCOUNTER — Other Ambulatory Visit: Payer: Self-pay | Admitting: Gynecology

## 2019-10-19 DIAGNOSIS — Z0000 Encounter for general adult medical examination without abnormal findings: Secondary | ICD-10-CM

## 2020-01-24 ENCOUNTER — Ambulatory Visit
Admission: RE | Admit: 2020-01-24 | Discharge: 2020-01-24 | Disposition: A | Discharge status: Home / Self Care | Payer: Medicare Other | Source: Ambulatory Visit | Attending: Gynecology | Admitting: Gynecology

## 2020-01-24 ENCOUNTER — Other Ambulatory Visit: Payer: Self-pay

## 2020-01-24 DIAGNOSIS — Z0000 Encounter for general adult medical examination without abnormal findings: Secondary | ICD-10-CM

## 2020-01-24 DIAGNOSIS — Z1231 Encounter for screening mammogram for malignant neoplasm of breast: Secondary | ICD-10-CM | POA: Diagnosis not present

## 2020-01-25 DIAGNOSIS — D229 Melanocytic nevi, unspecified: Secondary | ICD-10-CM | POA: Diagnosis not present

## 2020-01-25 DIAGNOSIS — L821 Other seborrheic keratosis: Secondary | ICD-10-CM | POA: Diagnosis not present

## 2020-01-25 DIAGNOSIS — L814 Other melanin hyperpigmentation: Secondary | ICD-10-CM | POA: Diagnosis not present

## 2020-01-25 DIAGNOSIS — L905 Scar conditions and fibrosis of skin: Secondary | ICD-10-CM | POA: Diagnosis not present

## 2020-01-25 DIAGNOSIS — T148XXA Other injury of unspecified body region, initial encounter: Secondary | ICD-10-CM | POA: Diagnosis not present

## 2020-01-25 DIAGNOSIS — L738 Other specified follicular disorders: Secondary | ICD-10-CM | POA: Diagnosis not present

## 2020-01-25 DIAGNOSIS — L718 Other rosacea: Secondary | ICD-10-CM | POA: Diagnosis not present

## 2020-01-25 DIAGNOSIS — Z85828 Personal history of other malignant neoplasm of skin: Secondary | ICD-10-CM | POA: Diagnosis not present

## 2020-02-13 DIAGNOSIS — R1310 Dysphagia, unspecified: Secondary | ICD-10-CM | POA: Diagnosis not present

## 2020-02-13 DIAGNOSIS — K219 Gastro-esophageal reflux disease without esophagitis: Secondary | ICD-10-CM | POA: Diagnosis not present

## 2020-02-29 IMAGING — MG DIGITAL SCREENING BILAT W/ TOMO W/ CAD
8 series · 9 of 24 positions shown · non-contrast
Comparison: Previous exam(s).

CLINICAL DATA: Screening.

EXAM:
DIGITAL SCREENING BILATERAL MAMMOGRAM WITH TOMO AND CAD

[L CC synth-2D]
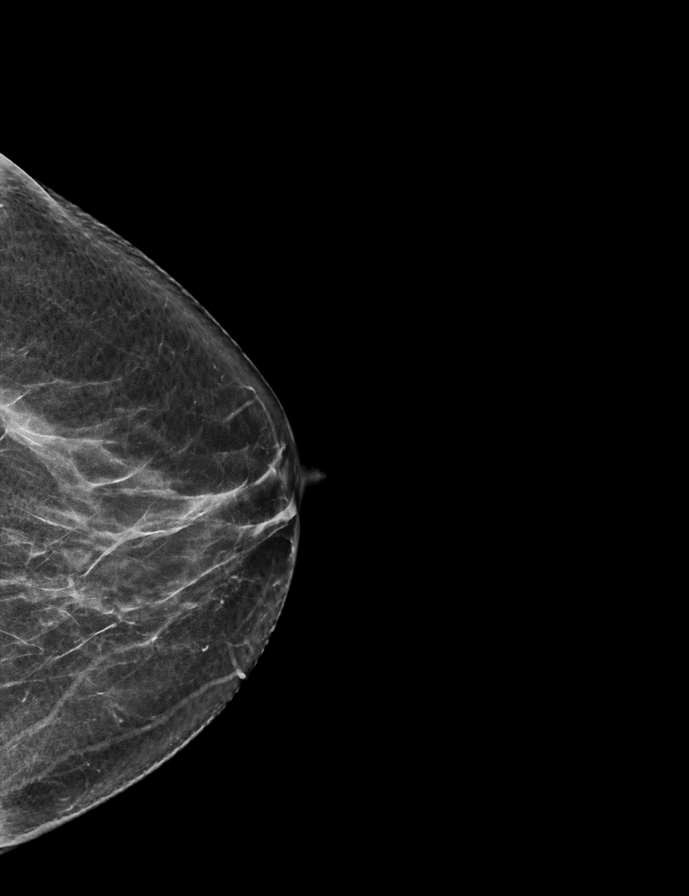

[R MLO synth-2D]
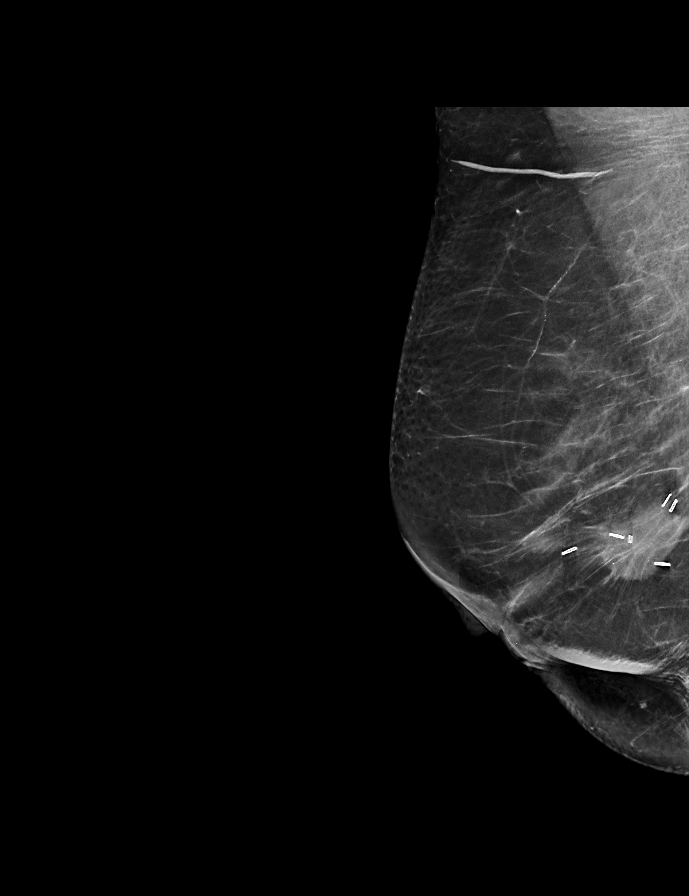

[R CC synth-2D]
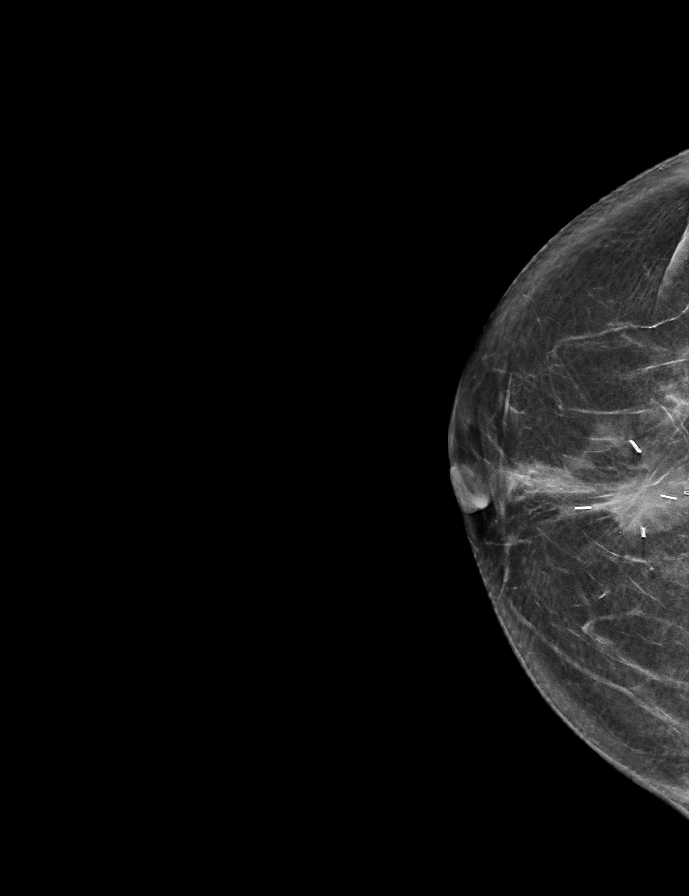

[L MLO synth-2D]
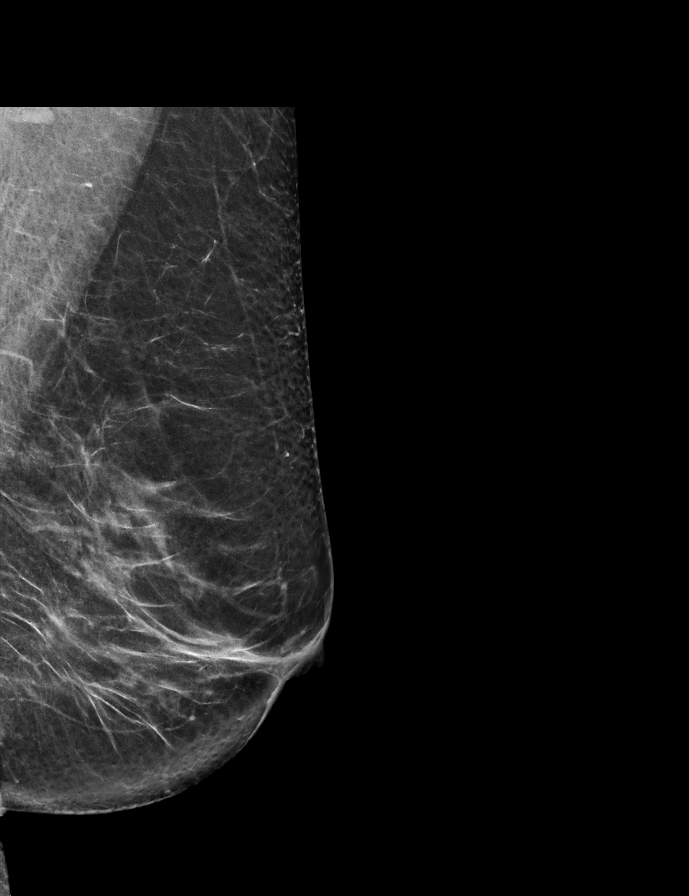

[R CC tomo · 2 of 65 frames shown]
[frame 21/65]
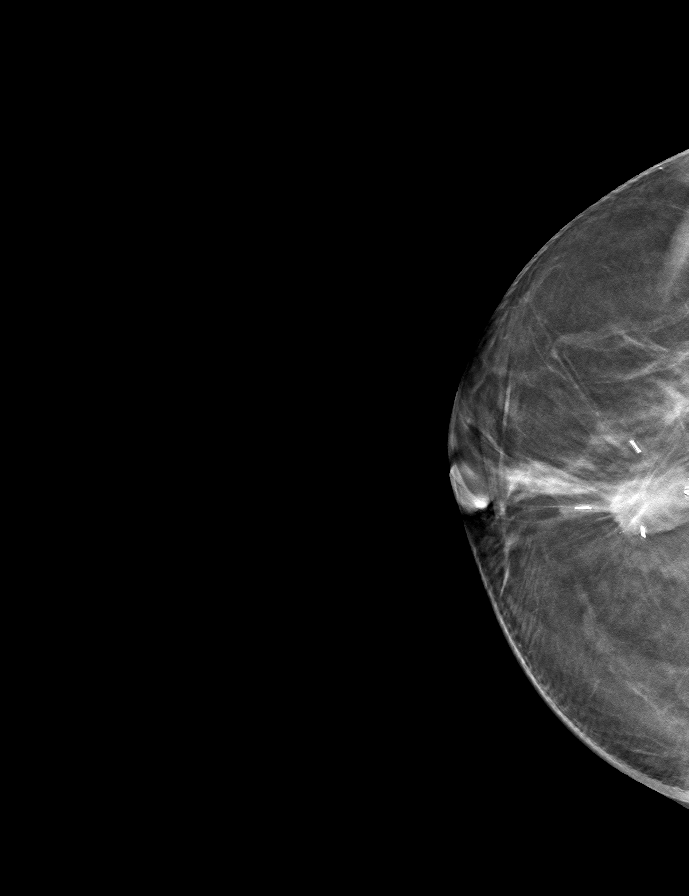
[frame 33/65]
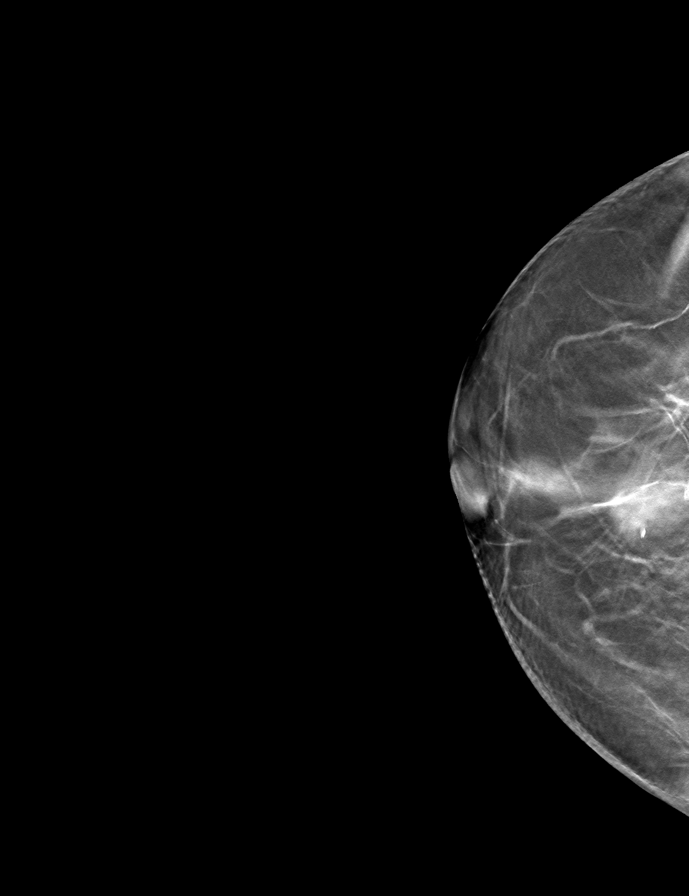

[R MLO tomo · tomo slice 35/68.0]
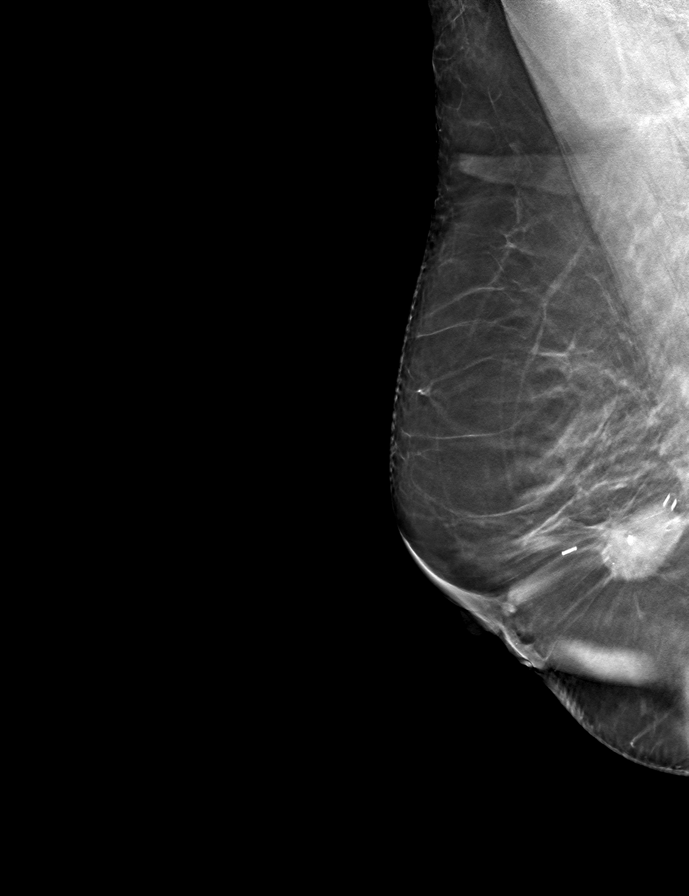

[L CC tomo · tomo slice 32/63.0]
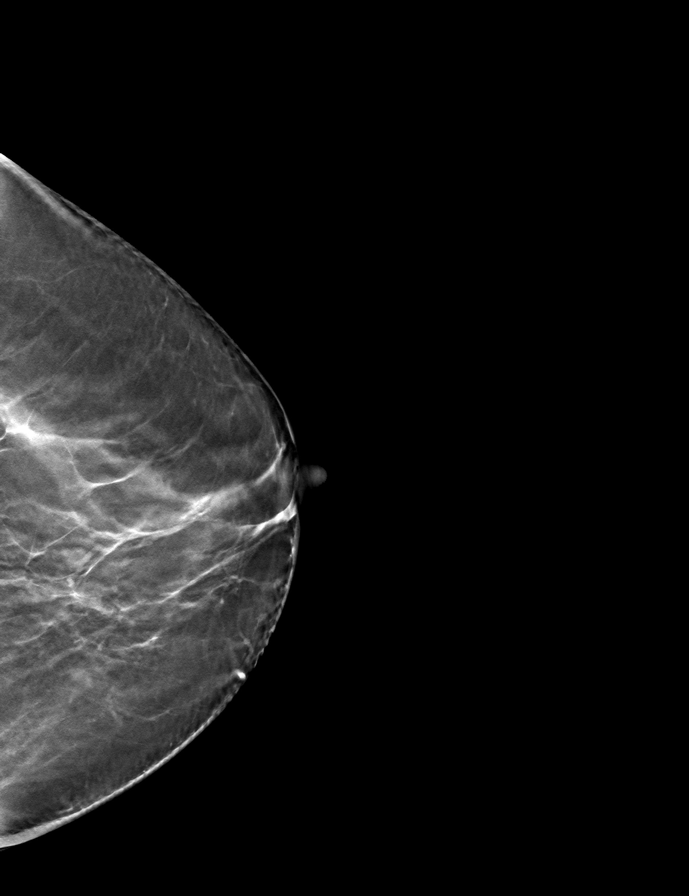

[L MLO tomo · tomo slice 35/68.0]
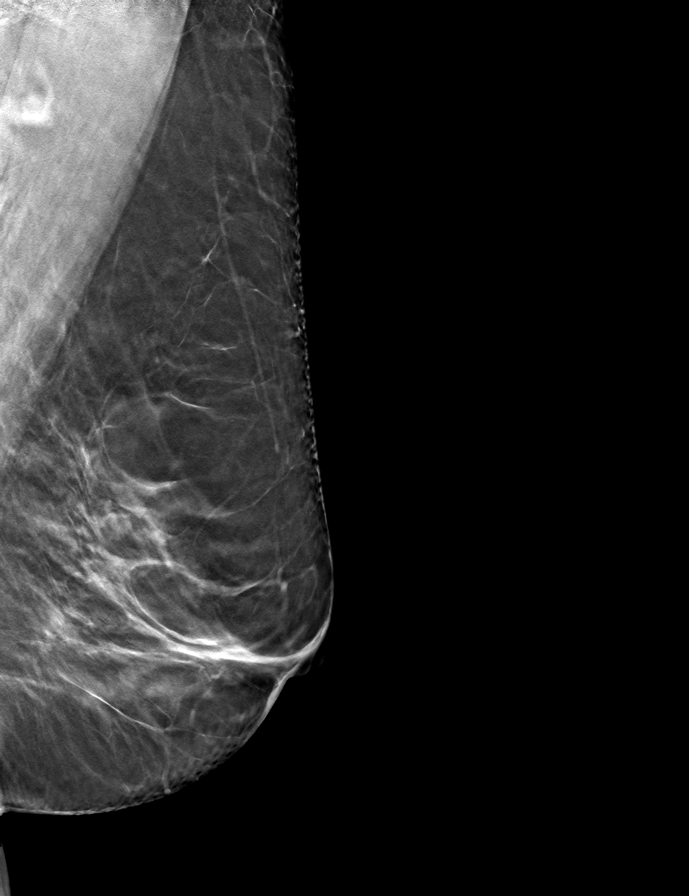

[9 of 24 positions shown; findings below may reference images not displayed]

ACR Breast Density Category b: There are scattered areas of
fibroglandular density.
FINDINGS: There are no findings suspicious for malignancy. Images were
processed with CAD.
IMPRESSION: No mammographic evidence of malignancy. A result letter of this
screening mammogram will be mailed directly to the patient.

RECOMMENDATION:
Screening mammogram in one year. (Code:[id number])

BI-RADS CATEGORY  1: Negative.

## 2020-06-06 DIAGNOSIS — D72819 Decreased white blood cell count, unspecified: Secondary | ICD-10-CM | POA: Diagnosis not present

## 2020-06-06 DIAGNOSIS — Z0000 Encounter for general adult medical examination without abnormal findings: Secondary | ICD-10-CM | POA: Diagnosis not present

## 2020-06-06 DIAGNOSIS — M8580 Other specified disorders of bone density and structure, unspecified site: Secondary | ICD-10-CM | POA: Diagnosis not present

## 2020-06-06 DIAGNOSIS — Z780 Asymptomatic menopausal state: Secondary | ICD-10-CM | POA: Diagnosis not present

## 2020-06-06 DIAGNOSIS — I10 Essential (primary) hypertension: Secondary | ICD-10-CM | POA: Diagnosis not present

## 2020-06-06 DIAGNOSIS — E7800 Pure hypercholesterolemia, unspecified: Secondary | ICD-10-CM | POA: Diagnosis not present

## 2020-06-06 DIAGNOSIS — R760 Raised antibody titer: Secondary | ICD-10-CM | POA: Diagnosis not present

## 2020-06-11 DIAGNOSIS — Z0000 Encounter for general adult medical examination without abnormal findings: Secondary | ICD-10-CM | POA: Diagnosis not present

## 2020-06-11 DIAGNOSIS — I10 Essential (primary) hypertension: Secondary | ICD-10-CM | POA: Diagnosis not present

## 2020-06-11 DIAGNOSIS — K219 Gastro-esophageal reflux disease without esophagitis: Secondary | ICD-10-CM | POA: Diagnosis not present

## 2020-06-11 DIAGNOSIS — Z23 Encounter for immunization: Secondary | ICD-10-CM | POA: Diagnosis not present

## 2020-06-11 DIAGNOSIS — E7800 Pure hypercholesterolemia, unspecified: Secondary | ICD-10-CM | POA: Diagnosis not present

## 2020-06-11 DIAGNOSIS — Z853 Personal history of malignant neoplasm of breast: Secondary | ICD-10-CM | POA: Diagnosis not present

## 2020-06-11 DIAGNOSIS — R7303 Prediabetes: Secondary | ICD-10-CM | POA: Diagnosis not present

## 2020-07-18 DIAGNOSIS — M549 Dorsalgia, unspecified: Secondary | ICD-10-CM | POA: Diagnosis not present

## 2020-07-18 DIAGNOSIS — R197 Diarrhea, unspecified: Secondary | ICD-10-CM | POA: Diagnosis not present

## 2020-07-18 DIAGNOSIS — R300 Dysuria: Secondary | ICD-10-CM | POA: Diagnosis not present

## 2020-08-03 DIAGNOSIS — H524 Presbyopia: Secondary | ICD-10-CM | POA: Diagnosis not present

## 2020-08-03 DIAGNOSIS — H52222 Regular astigmatism, left eye: Secondary | ICD-10-CM | POA: Diagnosis not present

## 2020-08-03 DIAGNOSIS — H35033 Hypertensive retinopathy, bilateral: Secondary | ICD-10-CM | POA: Diagnosis not present

## 2020-12-10 DIAGNOSIS — E7800 Pure hypercholesterolemia, unspecified: Secondary | ICD-10-CM | POA: Diagnosis not present

## 2020-12-10 DIAGNOSIS — Z23 Encounter for immunization: Secondary | ICD-10-CM | POA: Diagnosis not present

## 2020-12-10 DIAGNOSIS — R7303 Prediabetes: Secondary | ICD-10-CM | POA: Diagnosis not present

## 2020-12-17 DIAGNOSIS — Z23 Encounter for immunization: Secondary | ICD-10-CM | POA: Diagnosis not present

## 2020-12-17 DIAGNOSIS — R7303 Prediabetes: Secondary | ICD-10-CM | POA: Diagnosis not present

## 2020-12-17 DIAGNOSIS — E7800 Pure hypercholesterolemia, unspecified: Secondary | ICD-10-CM | POA: Diagnosis not present

## 2020-12-17 DIAGNOSIS — K219 Gastro-esophageal reflux disease without esophagitis: Secondary | ICD-10-CM | POA: Diagnosis not present

## 2020-12-17 DIAGNOSIS — I10 Essential (primary) hypertension: Secondary | ICD-10-CM | POA: Diagnosis not present

## 2020-12-17 DIAGNOSIS — Z853 Personal history of malignant neoplasm of breast: Secondary | ICD-10-CM | POA: Diagnosis not present

## 2020-12-31 ENCOUNTER — Other Ambulatory Visit: Payer: Self-pay | Admitting: Gynecology

## 2020-12-31 DIAGNOSIS — Z1231 Encounter for screening mammogram for malignant neoplasm of breast: Secondary | ICD-10-CM

## 2021-01-30 ENCOUNTER — Ambulatory Visit
Admission: RE | Admit: 2021-01-30 | Discharge: 2021-01-30 | Disposition: A | Discharge status: Home / Self Care | Payer: Medicare Other | Source: Ambulatory Visit | Attending: Gynecology | Admitting: Gynecology

## 2021-01-30 DIAGNOSIS — Z1231 Encounter for screening mammogram for malignant neoplasm of breast: Secondary | ICD-10-CM

## 2021-06-24 DIAGNOSIS — I10 Essential (primary) hypertension: Secondary | ICD-10-CM | POA: Diagnosis not present

## 2021-06-24 DIAGNOSIS — K219 Gastro-esophageal reflux disease without esophagitis: Secondary | ICD-10-CM | POA: Diagnosis not present

## 2021-06-24 DIAGNOSIS — Z853 Personal history of malignant neoplasm of breast: Secondary | ICD-10-CM | POA: Diagnosis not present

## 2021-06-24 DIAGNOSIS — E7800 Pure hypercholesterolemia, unspecified: Secondary | ICD-10-CM | POA: Diagnosis not present

## 2021-06-24 DIAGNOSIS — Z0000 Encounter for general adult medical examination without abnormal findings: Secondary | ICD-10-CM | POA: Diagnosis not present

## 2021-06-24 DIAGNOSIS — R7303 Prediabetes: Secondary | ICD-10-CM | POA: Diagnosis not present

## 2021-09-10 ENCOUNTER — Emergency Department (HOSPITAL_BASED_OUTPATIENT_CLINIC_OR_DEPARTMENT_OTHER): Payer: Medicare Other

## 2021-09-10 ENCOUNTER — Other Ambulatory Visit: Payer: Self-pay

## 2021-09-10 ENCOUNTER — Inpatient Hospital Stay (HOSPITAL_BASED_OUTPATIENT_CLINIC_OR_DEPARTMENT_OTHER)
Admission: EM | Admit: 2021-09-10 | Discharge: 2021-09-13 | DRG: 522 | Disposition: A | Discharge status: Home Health | Payer: Medicare Other | Attending: Family Medicine | Admitting: Family Medicine

## 2021-09-10 ENCOUNTER — Encounter (HOSPITAL_COMMUNITY): Payer: Self-pay

## 2021-09-10 ENCOUNTER — Encounter (HOSPITAL_BASED_OUTPATIENT_CLINIC_OR_DEPARTMENT_OTHER): Payer: Self-pay | Admitting: Pharmacy Technician

## 2021-09-10 ENCOUNTER — Emergency Department (HOSPITAL_BASED_OUTPATIENT_CLINIC_OR_DEPARTMENT_OTHER): Payer: Medicare Other | Admitting: Radiology

## 2021-09-10 DIAGNOSIS — S72041A Displaced fracture of base of neck of right femur, initial encounter for closed fracture: Secondary | ICD-10-CM | POA: Diagnosis not present

## 2021-09-10 DIAGNOSIS — E7800 Pure hypercholesterolemia, unspecified: Secondary | ICD-10-CM | POA: Diagnosis present

## 2021-09-10 DIAGNOSIS — Z923 Personal history of irradiation: Secondary | ICD-10-CM

## 2021-09-10 DIAGNOSIS — Z853 Personal history of malignant neoplasm of breast: Secondary | ICD-10-CM | POA: Diagnosis not present

## 2021-09-10 DIAGNOSIS — S72091A Other fracture of head and neck of right femur, initial encounter for closed fracture: Secondary | ICD-10-CM | POA: Diagnosis not present

## 2021-09-10 DIAGNOSIS — D62 Acute posthemorrhagic anemia: Secondary | ICD-10-CM | POA: Diagnosis not present

## 2021-09-10 DIAGNOSIS — S72001A Fracture of unspecified part of neck of right femur, initial encounter for closed fracture: Secondary | ICD-10-CM | POA: Diagnosis not present

## 2021-09-10 DIAGNOSIS — C50311 Malignant neoplasm of lower-inner quadrant of right female breast: Secondary | ICD-10-CM | POA: Diagnosis not present

## 2021-09-10 DIAGNOSIS — Y9239 Other specified sports and athletic area as the place of occurrence of the external cause: Secondary | ICD-10-CM

## 2021-09-10 DIAGNOSIS — R739 Hyperglycemia, unspecified: Secondary | ICD-10-CM | POA: Diagnosis not present

## 2021-09-10 DIAGNOSIS — Y93B9 Activity, other involving muscle strengthening exercises: Secondary | ICD-10-CM | POA: Diagnosis not present

## 2021-09-10 DIAGNOSIS — E782 Mixed hyperlipidemia: Secondary | ICD-10-CM | POA: Diagnosis not present

## 2021-09-10 DIAGNOSIS — I10 Essential (primary) hypertension: Secondary | ICD-10-CM | POA: Diagnosis present

## 2021-09-10 DIAGNOSIS — Z801 Family history of malignant neoplasm of trachea, bronchus and lung: Secondary | ICD-10-CM | POA: Diagnosis not present

## 2021-09-10 DIAGNOSIS — Z471 Aftercare following joint replacement surgery: Secondary | ICD-10-CM | POA: Diagnosis not present

## 2021-09-10 DIAGNOSIS — E785 Hyperlipidemia, unspecified: Secondary | ICD-10-CM | POA: Diagnosis present

## 2021-09-10 DIAGNOSIS — Z96641 Presence of right artificial hip joint: Secondary | ICD-10-CM | POA: Diagnosis not present

## 2021-09-10 DIAGNOSIS — Z85828 Personal history of other malignant neoplasm of skin: Secondary | ICD-10-CM

## 2021-09-10 DIAGNOSIS — S51011A Laceration without foreign body of right elbow, initial encounter: Secondary | ICD-10-CM | POA: Diagnosis present

## 2021-09-10 DIAGNOSIS — Z90710 Acquired absence of both cervix and uterus: Secondary | ICD-10-CM | POA: Diagnosis not present

## 2021-09-10 DIAGNOSIS — Z79899 Other long term (current) drug therapy: Secondary | ICD-10-CM | POA: Diagnosis not present

## 2021-09-10 DIAGNOSIS — D6862 Lupus anticoagulant syndrome: Secondary | ICD-10-CM

## 2021-09-10 DIAGNOSIS — Z170 Estrogen receptor positive status [ER+]: Secondary | ICD-10-CM

## 2021-09-10 DIAGNOSIS — Z808 Family history of malignant neoplasm of other organs or systems: Secondary | ICD-10-CM

## 2021-09-10 DIAGNOSIS — W010XXA Fall on same level from slipping, tripping and stumbling without subsequent striking against object, initial encounter: Secondary | ICD-10-CM | POA: Diagnosis present

## 2021-09-10 DIAGNOSIS — M79671 Pain in right foot: Secondary | ICD-10-CM | POA: Diagnosis not present

## 2021-09-10 DIAGNOSIS — F419 Anxiety disorder, unspecified: Secondary | ICD-10-CM | POA: Insufficient documentation

## 2021-09-10 DIAGNOSIS — Z01818 Encounter for other preprocedural examination: Secondary | ICD-10-CM | POA: Diagnosis not present

## 2021-09-10 HISTORY — DX: Other specified postprocedural states: R11.2

## 2021-09-10 HISTORY — DX: Other specified postprocedural states: Z98.890

## 2021-09-10 LAB — CBC WITH DIFFERENTIAL/PLATELET
Abs Immature Granulocytes: 0.03 10*3/uL (ref 0.00–0.07)
Basophils Absolute: 0 10*3/uL (ref 0.0–0.1)
Basophils Relative: 0 %
Eosinophils Absolute: 0.1 10*3/uL (ref 0.0–0.5)
Eosinophils Relative: 1 %
HCT: 39.5 % (ref 36.0–46.0)
Hemoglobin: 13.4 g/dL (ref 12.0–15.0)
Immature Granulocytes: 0 %
Lymphocytes Relative: 17 %
Lymphs Abs: 1.3 10*3/uL (ref 0.7–4.0)
MCH: 29.3 pg (ref 26.0–34.0)
MCHC: 33.9 g/dL (ref 30.0–36.0)
MCV: 86.2 fL (ref 80.0–100.0)
Monocytes Absolute: 0.4 10*3/uL (ref 0.1–1.0)
Monocytes Relative: 5 %
Neutro Abs: 5.5 10*3/uL (ref 1.7–7.7)
Neutrophils Relative %: 77 %
Platelets: 233 10*3/uL (ref 150–400)
RBC: 4.58 MIL/uL (ref 3.87–5.11)
RDW: 14.5 % (ref 11.5–15.5)
WBC: 7.3 10*3/uL (ref 4.0–10.5)
nRBC: 0 % (ref 0.0–0.2)

## 2021-09-10 LAB — MAGNESIUM: Magnesium: 2.1 mg/dL (ref 1.7–2.4)

## 2021-09-10 LAB — BASIC METABOLIC PANEL
Anion gap: 10 (ref 5–15)
BUN: 21 mg/dL (ref 8–23)
CO2: 24 mmol/L (ref 22–32)
Calcium: 9.3 mg/dL (ref 8.9–10.3)
Chloride: 104 mmol/L (ref 98–111)
Creatinine, Ser: 0.88 mg/dL (ref 0.44–1.00)
GFR, Estimated: >60 mL/min (ref >60)
Glucose, Bld: 111 mg/dL — ABNORMAL HIGH (ref 70–99)
Potassium: 3.8 mmol/L (ref 3.5–5.1)
Sodium: 138 mmol/L (ref 135–145)

## 2021-09-10 LAB — PHOSPHORUS: Phosphorus: 3.8 mg/dL (ref 2.5–4.6)

## 2021-09-10 LAB — TYPE AND SCREEN
ABO/RH(D): O POS
Antibody Screen: NEGATIVE

## 2021-09-10 LAB — PROTIME-INR
INR: 0.9 (ref 0.8–1.2)
Prothrombin Time: 12.4 seconds (ref 11.4–15.2)

## 2021-09-10 LAB — HIV ANTIBODY (ROUTINE TESTING W REFLEX): HIV Screen 4th Generation wRfx: NONREACTIVE

## 2021-09-10 LAB — ABO/RH: ABO/RH(D): O POS

## 2021-09-10 LAB — CK: Total CK: 116 U/L (ref 38–234)

## 2021-09-10 MED ORDER — MORPHINE SULFATE (PF) 2 MG/ML IV SOLN: 0.5000 mg | INTRAVENOUS | Status: DC | PRN

## 2021-09-10 MED ORDER — METHOCARBAMOL 1000 MG/10ML IJ SOLN: 500.0000 mg | Freq: Four times a day (QID) | INTRAVENOUS | Status: DC | PRN

## 2021-09-10 MED ORDER — METHOCARBAMOL 500 MG PO TABS: 500.0000 mg | ORAL_TABLET | Freq: Four times a day (QID) | ORAL | Status: DC | PRN

## 2021-09-10 MED ORDER — ONDANSETRON HCL 4 MG/2ML IJ SOLN
4.0000 mg | Freq: Once | INTRAMUSCULAR | Status: AC
  Administered 2021-09-10: 4 mg via INTRAVENOUS
  Filled 2021-09-10: qty 2

## 2021-09-10 MED ORDER — SIMVASTATIN 20 MG PO TABS
10.0000 mg | ORAL_TABLET | Freq: Every day | ORAL | Status: DC
  Administered 2021-09-10 – 2021-09-13 (×3): 10 mg via ORAL
  Filled 2021-09-10 (×4): qty 1

## 2021-09-10 MED ORDER — HYDROCODONE-ACETAMINOPHEN 5-325 MG PO TABS
1.0000 | ORAL_TABLET | Freq: Four times a day (QID) | ORAL | Status: DC | PRN
  Administered 2021-09-10 – 2021-09-11 (×2): 1 via ORAL
  Administered 2021-09-11: 2 via ORAL
  Filled 2021-09-10: qty 1
  Filled 2021-09-10: qty 2
  Filled 2021-09-10: qty 1

## 2021-09-10 MED ORDER — HYDROMORPHONE HCL 1 MG/ML IJ SOLN
0.5000 mg | INTRAMUSCULAR | Status: AC | PRN
  Administered 2021-09-10 (×2): 0.5 mg via INTRAVENOUS
  Filled 2021-09-10 (×2): qty 1

## 2021-09-10 MED ORDER — AMLODIPINE BESYLATE 5 MG PO TABS
5.0000 mg | ORAL_TABLET | Freq: Every day | ORAL | Status: DC
  Administered 2021-09-10 – 2021-09-13 (×3): 5 mg via ORAL
  Filled 2021-09-10 (×4): qty 1

## 2021-09-10 NOTE — ED Notes (Signed)
Patient updated on transport and medicated for pain prior to transport

## 2021-09-10 NOTE — Assessment & Plan Note (Signed)
In remission.

## 2021-09-10 NOTE — ED Provider Notes (Addendum)
[facility] EMERGENCY DEPT Provider Note   CSN: [id number] Arrival date & time: 09/10/21  1115     History  Chief Complaint  Patient presents with   Fall   Hip Pain    Kristin Mills is a 66 y.o. female.  Patient with history of breast cancer, prediabetes, high cholesterol, hypertension presents to the emergency department today for evaluation of acute onset right hip pain.  She was participating in a group exercise class at [employer] when she stumbled and fell, landing on her right hip.  Denies preceding lightheadedness, syncope, chest pain or shortness of breath.  She states that she was helped by bystanders to a standing position and placed into a chair.  When she sat down in the chair she "felt something shift".  She had severe pain.  She was able to be assisted into a vehicle and driven to the emergency department by her husband today.  She denies head or neck injury.  No treatments for injury prior to arrival.  Pain is much worse with movement.  Pain is worse in the hip area and then radiates down into her leg.  She also sustained a small skin tear to her right elbow, however is able to move well.  Denies significant history of osteoporosis.       Home Medications Prior to Admission medications   Medication Sig Start Date End Date Taking? Authorizing Provider  amLODipine (NORVASC) 5 MG tablet Take 5 mg by mouth daily. 08/07/21   [provider]  aspirin EC 81 MG tablet Take 81 mg by mouth daily.     [provider]  Cholecalciferol (VITAMIN D-3 PO) Take 2,000 Units by mouth daily.    [provider]  Guaifenesin (MUCINEX MAXIMUM STRENGTH) 1200 MG TB12 Take 1 tablet (1,200 mg total) by mouth every 12 (twelve) hours as needed. Patient not taking: Reported on 08/27/2016 02/02/16   Ivar Drape D, PA  Loratadine (CLARITIN REDITABS) 5 MG TBDP Take 10 mg by mouth daily as needed (allergies). For allergies     [provider]   magnesium oxide (MAG-OX) 400 MG tablet Take 400 mg by mouth daily.    [provider]  Multiple Vitamin (MULTIVITAMIN) capsule Take 1 capsule by mouth daily.    [provider]  simvastatin (ZOCOR) 10 MG tablet Take 10 mg by mouth daily.    [provider]      Allergies    Patient has no known allergies.    Review of Systems   Review of Systems  Physical Exam Updated Vital Signs BP 118/67 (BP Location: Left Arm)   Pulse 83   Temp 97.8 F (36.6 C) (Oral)   Resp 15   SpO2 100%   Physical Exam Vitals and nursing note reviewed.  Constitutional:      General: She is not in acute distress.    Appearance: She is well-developed.  HENT:     Head: Normocephalic and atraumatic.     Right Ear: External ear normal.     Left Ear: External ear normal.     Nose: Nose normal.  Eyes:     Conjunctiva/sclera: Conjunctivae normal.  Cardiovascular:     Rate and Rhythm: Normal rate and regular rhythm.     Heart sounds: No murmur heard. Pulmonary:     Effort: No respiratory distress.     Breath sounds: No wheezing, rhonchi or rales.  Abdominal:     Palpations: Abdomen is soft.  Tenderness: There is no abdominal tenderness. There is no guarding or rebound.     Comments: Abdomen is soft and nontender.  Musculoskeletal:     Cervical back: Neck supple. No tenderness. Normal range of motion.     Right hip: Tenderness present. Decreased range of motion.     Right upper leg: Tenderness present. No deformity.     Right knee: No effusion or erythema. Decreased range of motion. Tenderness present.     Right lower leg: No deformity. No edema.     Left lower leg: No edema.  Skin:    General: Skin is warm and dry.     Findings: No rash.  Neurological:     General: No focal deficit present.     Mental Status: She is alert. Mental status is at baseline.     Motor: No weakness.  Psychiatric:        Mood and Affect: Mood normal.     ED Results / Procedures /  Treatments   Labs (all labs ordered are listed, but only abnormal results are displayed) Labs Reviewed  BASIC METABOLIC PANEL - Abnormal; Notable for the following components:      Result Value   Glucose, Bld 111 (*)    All other components within normal limits  CBC WITH DIFFERENTIAL/PLATELET  PROTIME-INR  TYPE AND SCREEN    ED ECG REPORT   Date: 09/10/2021  Rate: 80  Rhythm: normal sinus rhythm  QRS Axis: left  Intervals: normal  ST/T Wave abnormalities: normal  Conduction Disutrbances:left anterior fascicular block  Narrative Interpretation:   Old EKG Reviewed: unchanged from 06/2015  I have personally reviewed the EKG tracing and agree with the computerized printout as noted.   Radiology DG Hip Unilat W or Wo Pelvis 2-3 Views Right  Result Date: 09/10/2021 CLINICAL DATA:  swelling EXAM: DG HIP (WITH OR WITHOUT PELVIS) 2-3V RIGHT COMPARISON:  None Available. FINDINGS: Acute right femoral neck fracture with impaction and proximal displacement. Femoral head appears located. Surrounding soft tissue swelling. IMPRESSION: Acute right femoral neck fracture with impaction and proximal displacement. Electronically Signed   By: Margaretha Sheffield M.D.   On: 09/10/2021 12:30    Procedures Procedures    Medications Ordered in ED Medications  HYDROmorphone (DILAUDID) injection 0.5 mg (0.5 mg Intravenous Given 09/10/21 1259)  ondansetron (ZOFRAN) injection 4 mg (4 mg Intravenous Given 09/10/21 1300)    ED Course/ Medical Decision Making/ A&P    Patient seen and examined. History obtained directly from patient. Work-up including labs, imaging, EKG ordered in triage, if performed, were reviewed.    Labs/EKG: Ordered CBC, CMP, type and screen, INR after initial imaging reviewed.  Also EKG.  Imaging: Independently visualized and interpreted.  This included: X-ray of the pelvis and right hip, agree right femoral neck fracture noted.  Added chest x-ray.  Medications/Fluids: Ordered:  Dilaudid 0.5 mg, Zofran 4 mg.  Most recent vital signs reviewed and are as follows: BP 138/88   Pulse 75   Temp 97.8 F (36.6 C) (Oral)   Resp 12   SpO2 100%   Initial impression: Acute right femoral neck fracture, fall mechanical in nature, no head or neck injury.  1:48 PM Reassessment performed. Patient appears stable.  Requesting something to eat.  I consulted previously with Dr. Lorin Mercy by telephone.  Requests admission to the hospitalist service, at [facility].  He will operate tentatively tomorrow afternoon.  He will see the patient in the morning.  Labs personally reviewed and interpreted  including: BMP unremarkable CBC unremarkable.  Reviewed pertinent lab work and imaging with patient at bedside. Questions answered.   Most current vital signs reviewed and are as follows: BP (!) 145/83   Pulse 74   Temp 97.8 F (36.6 C) (Oral)   Resp 13   SpO2 97%   Plan: Admit to hospital.  2:08 PM   Consulted with Dr. Rogers Blocker with [facility] hospitalist who accepts patient for admission.                           Medical Decision Making Amount and/or Complexity of Data Reviewed Labs: ordered. Radiology: ordered.  Risk Prescription drug management. Decision regarding hospitalization.   Patient with acute femoral neck fracture.  Fall was mechanical in nature.  No head or neck injury suspected.  Labs unremarkable.  Lower extremity is neurovascularly intact without changes during ED stay.  No signs of compartment syndrome or significant bleeding.    Final Clinical Impression(s) / ED Diagnoses Final diagnoses:  Closed fracture of neck of right femur, initial encounter [facility])    Rx / [place] Orders ED Discharge Orders     None         Carlisle Cater, PA-C 09/10/21 1410    Carlisle Cater, PA-C 09/10/21 [place], Nathan, MD 09/10/21 1544

## 2021-09-10 NOTE — H&P (Signed)
Kristin Mills WCB:[id number] DOB: [birth year] DOA: 09/10/2021      PCP:  [name]:     Patient arrived to ER on 09/10/21 at 1115 Referred by Attending Orma Flaming, MD    Patient coming from:    home Lives With family    Chief Complaint:   Chief Complaint  Patient presents with   Fall   Hip Pain    HPI: Kristin Mills is a 66 y.o. female with medical history significant of  breast cancer I remission,  high cholesterol, hypertension, hx of Mitral valve prolapse     Presented with   fall on right hip Pt had a mechanical fall at the gym on right hip was helped up  Once sitting the chair felt a pop and severe pain  Nohead injury no LOC  Unable to bear weight, pain worse with movement  No tobacco , occasional ETOH Has mild right elbow skin injury from the fall   No hx of CAD  Very active at baseline Does a dance class once a week no CP no SOB at rest or with exertion    Regarding pertinent Chronic problems:     Hyperlipidemia -  on statins Zocor (simvastatin)     HTN on NOrvasc     While in ER:  Hip imaging Acute right femoral neck fracture with impaction and proximal displacement.   Right knee - negative  CXR -  NON acute   Following Medications were ordered in ER: Medications  HYDROmorphone (DILAUDID) injection 0.5 mg (0.5 mg Intravenous Given 09/10/21 1659)  ondansetron (ZOFRAN) injection 4 mg (4 mg Intravenous Given 09/10/21 1300)    _______________________________________________________ ER Provider Called: Orthopedics    Dr Lorin Mercy They Recommend admit to medicine   Will see in AM      ED Triage Vitals  Enc Vitals Group     BP 09/10/21 1143 118/67     Pulse Rate 09/10/21 1143 83     Resp 09/10/21 1143 15     Temp 09/10/21 1143 97.8 F (36.6 C)     Temp Source 09/10/21 1143 Oral     SpO2 09/10/21 1143 100 %     Weight --      Height --      Head Circumference --      Peak Flow --      Pain Score 09/10/21 1147 5     Pain  Loc --      Pain Edu? --      Excl. in [place]? --   TMAX(24)@     _________________________________________ Significant initial  Findings: Abnormal Labs Reviewed  BASIC METABOLIC PANEL - Abnormal; Notable for the following components:      Result Value   Glucose, Bld 111 (*)    All other components within normal limits     ECG: Ordered Personally reviewed and interpreted by me showing: HR : 80 Rhythm: NSR,   no evidence of ischemic changes QTC 484    The recent clinical data is shown below. Vitals:   09/10/21 1530 09/10/21 1600 09/10/21 1630 09/10/21 1714  BP: (!) 143/74 131/69 139/73   Pulse: 67 71 72   Resp: 10 (!) 7 12   Temp:    98.3 F (36.8 C)  TempSrc:    Oral  SpO2: 94% 94% 94%     WBC     Component Value Date/Time   WBC 7.3 09/10/2021 1257   LYMPHSABS 1.3 09/10/2021 1257  LYMPHSABS 1.2 05/16/2015 1446   MONOABS 0.4 09/10/2021 1257   MONOABS 0.4 05/16/2015 1446   EOSABS 0.1 09/10/2021 1257   EOSABS 0.1 05/16/2015 1446   BASOSABS 0.0 09/10/2021 1257   BASOSABS 0.0 05/16/2015 1446    _______________________________________________ Hospitalist was called for admission for   Closed fracture of neck of right femur,      The following Work up has been ordered so far:  Orders Placed This Encounter  Procedures   DG Hip Unilat W or Wo Pelvis 2-3 Views Right   DG Chest Port 1 View   DG Knee 1-2 Views Right   Basic metabolic panel   CBC WITH DIFFERENTIAL   Protime-INR   Diet Heart Room service appropriate? Yes; Fluid consistency: Thin   Diet NPO time specified   Apply ice to affected area (if injury is <48 hours old)   Remove jewelry   Check CMS   Bed rest   Initiate [place] Fluid Protocol   Consult to orthopedic surgery  Dr. Lorin Mercy   Consult to hospitalist  [place]   ED EKG   Admit to Inpatient (patient's expected length of stay will be greater than 2 midnights or inpatient only procedure)     OTHER Significant initial  Findings:  labs  showing:    Recent Labs  Lab 09/10/21 1257  NA 138  K 3.8  CO2 24  GLUCOSE 111*  BUN 21  CREATININE 0.88  CALCIUM 9.3    Cr  stable,   Lab Results  Component Value Date   CREATININE 0.88 09/10/2021   CREATININE 0.9 05/16/2015   CREATININE 0.8 02/17/2014    No results for input(s): "AST", "ALT", "ALKPHOS", "BILITOT", "PROT", "ALBUMIN" in the last 168 hours. Lab Results  Component Value Date   CALCIUM 9.3 09/10/2021          Plt: Lab Results  Component Value Date   PLT 233 09/10/2021       Recent Labs  Lab 09/10/21 1257  WBC 7.3  NEUTROABS 5.5  HGB 13.4  HCT 39.5  MCV 86.2  PLT 233    HG/HCT   stable,       Component Value Date/Time   HGB 13.4 09/10/2021 1257   HGB 13.3 05/16/2015 1446   HCT 39.5 09/10/2021 1257   HCT 39.1 05/16/2015 1446   MCV 86.2 09/10/2021 1257   MCV 85.7 05/16/2015 1446     Cardiac Panel (last 3 results) No results for input(s): "CKTOTAL", "CKMB", "TROPONINI", "RELINDX" in the last 72 hours.     Cultures: No results found for: "SDES", "SPECREQUEST", "CULT", "REPTSTATUS"   Radiological Exams on Admission: DG Chest Port 1 View  Result Date: 09/10/2021 CLINICAL DATA:  pre-op right femoral neck fracture EXAM: PORTABLE CHEST 1 VIEW COMPARISON:  None Available. FINDINGS: The heart size and mediastinal contours are within normal limits. Both lungs are clear. No pleural effusion. The visualized skeletal structures are unremarkable. IMPRESSION: No active disease. Electronically Signed   By: Macy Mis M.D.   On: 09/10/2021 13:30   DG Knee 1-2 Views Right  Result Date: 09/10/2021 CLINICAL DATA:  Fall, pain, initial encounter. EXAM: RIGHT KNEE - 1-2 VIEW COMPARISON:  None Available. FINDINGS: No joint effusion or fracture.  Trace patellofemoral osteophytosis. IMPRESSION: 1. No acute findings. 2. Trace patellofemoral osteophytosis. Electronically Signed   By: Lorin Picket M.D.   On: 09/10/2021 13:26   DG Hip Unilat W or Wo Pelvis  2-3 Views Right  Result Date: 09/10/2021  CLINICAL DATA:  swelling EXAM: DG HIP (WITH OR WITHOUT PELVIS) 2-3V RIGHT COMPARISON:  None Available. FINDINGS: Acute right femoral neck fracture with impaction and proximal displacement. Femoral head appears located. Surrounding soft tissue swelling. IMPRESSION: Acute right femoral neck fracture with impaction and proximal displacement. Electronically Signed   By: Margaretha Sheffield M.D.   On: 09/10/2021 12:30   _______________________________________________________________________________________________________ Latest  Blood pressure 139/73, pulse 72, temperature 98.3 F (36.8 C), temperature source Oral, resp. rate 12, SpO2 94 %.   Vitals  labs and radiology finding personally reviewed  Review of Systems:    Pertinent positives include: right hip pain   Constitutional:  No weight loss, night sweats, Fevers, chills, fatigue, weight loss  HEENT:  No headaches, Difficulty swallowing,Tooth/dental problems,Sore throat,  No sneezing, itching, ear ache, nasal congestion, post nasal drip,  Cardio-vascular:  No chest pain, Orthopnea, PND, anasarca, dizziness, palpitations.no Bilateral lower extremity swelling  GI:  No heartburn, indigestion, abdominal pain, nausea, vomiting, diarrhea, change in bowel habits, loss of appetite, melena, blood in stool, hematemesis Resp:  no shortness of breath at rest. No dyspnea on exertion, No excess mucus, no productive cough, No non-productive cough, No coughing up of blood.No change in color of mucus.No wheezing. Skin:  no rash or lesions. No jaundice GU:  no dysuria, change in color of urine, no urgency or frequency. No straining to urinate.  No flank pain.  Musculoskeletal:  No joint pain or no joint swelling. No decreased range of motion. No back pain.  Psych:  No change in mood or affect. No depression or anxiety. No memory loss.  Neuro: no localizing neurological complaints, no tingling, no weakness, no  double vision, no gait abnormality, no slurred speech, no confusion  All systems reviewed and apart from [place] all are negative _______________________________________________________________________________________________ Past Medical History:   Past Medical History:  Diagnosis Date   Breast cancer ([place]) 12/09/11   DCIS right breast, ER+   Glaucoma, anatomical narrow angle    treated with laser surgery at [facility]   Headache(784.0)    sinus   History of radiation therapy 01/12/12 -02/11/12   right breast   Hypercholesterolemia    Lupus anticoagulant positive    Personal history of radiation therapy    Skin cancer    basal cell carcinoma; left side of mouth; removed bu Dr. Allyson Sabal in office      Past Surgical History:  Procedure Laterality Date   ABDOMINAL HYSTERECTOMY  1999   partial; left ovaries   BREAST LUMPECTOMY Right 2013   BREAST LUMPECTOMY WITH NEEDLE LOCALIZATION AND AXILLARY SENTINEL LYMPH NODE BX  12/09/2011   Procedure: BREAST LUMPECTOMY WITH NEEDLE LOCALIZATION AND AXILLARY SENTINEL LYMPH NODE BX;  Surgeon: Shann Medal, MD;  Location: [facility];  Service: General;  Laterality: Right;   BREAST SURGERY Right 2013   DILATION AND CURETTAGE OF UTERUS     EYE SURGERY     laser over 10 years both eyes   GANGLION CYST EXCISION     right   PERIPHERAL IRIDOTOMY  2009   bilateral    Social History:  Ambulatory   independently       reports that she has never smoked. She has never used smokeless tobacco. She reports current alcohol use of about 3.0 standard drinks of alcohol per week. She reports that she does not use drugs.     Family History:   Family History  Problem Relation Age of Onset   Skin cancer Mother  Lung cancer Father    Lung cancer Maternal Uncle    Lung cancer Paternal Uncle    ______________________________________________________________________________________________ Allergies: No Known Allergies   Prior to Admission medications    Medication Sig Start Date End Date Taking? Authorizing Provider  amLODipine (NORVASC) 5 MG tablet Take 5 mg by mouth daily. 08/07/21  Yes [provider]  simvastatin (ZOCOR) 10 MG tablet Take 10 mg by mouth daily.   Yes [provider]  aspirin EC 81 MG tablet Take 81 mg by mouth daily.     [provider]  Guaifenesin (MUCINEX MAXIMUM STRENGTH) 1200 MG TB12 Take 1 tablet (1,200 mg total) by mouth every 12 (twelve) hours as needed. Patient not taking: Reported on 08/27/2016 02/02/16   Ivar Drape D, [place]    ___________________________________________________________________________________________________ Physical Exam:    09/10/2021    4:30 PM 09/10/2021    4:00 PM 09/10/2021    3:30 PM  Vitals with BMI  Systolic 161 096 045  Diastolic 73 69 74  Pulse 72 71 67     1. General:  in No  Acute distress    Chronically ill  -appearing 2. Psychological: Alert and   Oriented 3. Head/ENT:   Dry Mucous Membranes                          Head Non traumatic, neck supple                             Poor Dentition 4. SKIN:  decreased Skin turgor,  Skin clean Dry and intact no rash, skin tear on right elbow 5. Heart: Regular rate and rhythm no  Murmur, no Rub or gallop 6. Lungs:  no wheezes or crackles   7. Abdomen: Soft,  non-tender, Non distended bowel sounds present 8. Lower extremities: no clubbing, cyanosis, no  edema 9. Neurologically Grossly intact, moving all 4 extremities equally  10. MSK: Normal range of motion  Limited motion in right leg  Chart has been reviewed  ______________________________________________________________________________________________  Assessment/Plan  66 y.o. 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  Keep nothing by mouth post midnight. Patient  not on anticoagulation, Ordered type and screen,  order a vitamin D level  Patient at baseline  able to walk a flight of stairs or 100 feet very active     Patient denies any chest pain or shortness of breath currently and/or with exertion,  ECG showing no evidence of acute ischemia  no known history of coronary artery disease, COPD   Liver failure   CKD  Given advanced age patient is at least moderate  Risk  which has been discussed with patient but at this point no furthther cardiac workup is indicated.         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  DVT prophylaxis:  SCD       Code Status:    Code Status: Not on file FULL CODE   as per patient   I had personally discussed CODE STATUS with patient     Family Communication:   Family   at  Bedside  plan of care was discussed  with   Husband,   Disposition Plan:     To home once workup is complete and patient is stable   Following barriers for discharge:                                                      Pain controlled with PO medications                                                           Will need consultants to evaluate patient prior to discharge   Consults called: orthopedics is aware  Admission status:  ED Disposition     ED Disposition  Admit   Condition  --   [place]: [place] [[id number]]  Level of Care: Med-Surg [16]  May admit patient to [facility] or [facility] if equivalent level of care is available:: No  Interfacility  transfer: Yes  Covid Evaluation: Asymptomatic - no recent exposure (last 10 days) testing not required  Diagnosis: Closed displaced fracture of right femoral neck [facility]) [[id number]]  Admitting Physician: Orma Flaming [[id number]]  Attending Physician: Orma Flaming [[id number]]  Certification:: I certify this patient will need inpatient services for at least 2 midnights  Estimated Length of Stay: 3            inpatient     I Expect 2 midnight stay secondary to severity of patient's current illness need for inpatient interventions justified by the following:     Severe lab/radiological/exam abnormalities including:    Right hip fracture and extensive comorbidities including:    malignancy,    That are currently affecting medical management.   I expect  patient to be hospitalized for 2 midnights requiring inpatient medical care.  Patient is at high risk for adverse outcome (such as loss of life or disability) if not treated.  Indication for inpatient stay as follows:    severe pain requiring acute inpatient management,  inability to maintain oral hydration    Need for operative/procedural  intervention     Need for   IV pain medications,      Level of care    medical floor          Toy Baker 09/10/2021, 8:19 PM    [facility]     after 2 AM please page floor coverage PA If 7AM-7PM, please contact the day team taking care of the patient using [email or web address]   Patient was evaluated in the context of the global COVID-19 pandemic, which necessitated consideration that the patient might be at risk for infection with the SARS-CoV-2 virus that causes COVID-19. Institutional protocols and algorithms that pertain to the evaluation of patients at risk for COVID-19 are in a  state of rapid change based on information released by regulatory bodies including the CDC and federal and state organizations. These policies and algorithms were followed during the patient's care.

## 2021-09-10 NOTE — Assessment & Plan Note (Signed)
continue Zocor 10 mg po daily

## 2021-09-10 NOTE — Assessment & Plan Note (Signed)
-   management as per orthopedics,  plan to operate   in  a.m.  Keep nothing by mouth post midnight. Patient  not on anticoagulation, Ordered type and screen,  order a vitamin D level  Patient at baseline  able to walk a flight of stairs or 100 feet very active     Patient denies any chest pain or shortness of breath currently and/or with exertion,  ECG showing no evidence of acute ischemia  no known history of coronary artery disease, COPD   Liver failure   CKD  Given advanced age patient is at least moderate  Risk  which has been discussed with patient but at this point no furthther cardiac workup is indicated.

## 2021-09-10 NOTE — Subjective & Objective (Signed)
Pt had a mechanical fall at the gym on right hip was helped up  Once sitting the chair felt a pop and severe pain  Nohead injury no LOC  Unable to bear weight, pain worse with movement

## 2021-09-10 NOTE — ED Triage Notes (Signed)
Pt was at a class at the gym and fell onto R hip and R elbow. Pt states initially was able to stand, but after she sat down she felt something shift and now unable to bear weight.

## 2021-09-10 NOTE — ED Notes (Signed)
Report given to [facility] 

## 2021-09-10 NOTE — Assessment & Plan Note (Signed)
Continue Norvasc at  80mgpo q day

## 2021-09-10 NOTE — ED Notes (Signed)
Report given to the floor. 

## 2021-09-10 NOTE — Progress Notes (Signed)
Plan of Care Note for accepted transfer   Patient: Kristin Mills MRN: [id number]   DOA: 09/10/2021  Facility requesting transfer: [facility] Requesting Provider: Dr. Alvino Chapel  Reason for transfer: right hip fracture  Facility course: 66 year old female with hx of breast cancer, HTN, HLD who fell while at a gym class onto her right hip. Can not bear weight found to have acute right femoral neck fracture with impaction and proximal displacement.   Vitals: stable. Labs: stable.  Discussed with ortho on call, Dr. Lorin Mercy. Plan for OR tomorrow.   Hip xray: acute right femoral neck fracture with impaction and proximal displacement.   Plan of care: The patient is accepted for admission to [place]  unit, at [facility]..    Author: Orma Flaming, MD 09/10/2021  Check [email or web address] for on-call coverage.  Nursing staff, Please call [place] number on Amion as soon as patient's arrival, so appropriate admitting provider can evaluate the pt.

## 2021-09-11 ENCOUNTER — Encounter (HOSPITAL_COMMUNITY)
Admission: EM | Disposition: A | Discharge status: Home Health | Payer: Self-pay | Source: Home / Self Care | Attending: Family Medicine

## 2021-09-11 ENCOUNTER — Other Ambulatory Visit: Payer: Self-pay

## 2021-09-11 ENCOUNTER — Encounter (HOSPITAL_COMMUNITY): Payer: Self-pay | Admitting: Family Medicine

## 2021-09-11 ENCOUNTER — Inpatient Hospital Stay (HOSPITAL_COMMUNITY): Payer: Medicare Other

## 2021-09-11 ENCOUNTER — Inpatient Hospital Stay (HOSPITAL_COMMUNITY): Payer: Medicare Other | Admitting: Anesthesiology

## 2021-09-11 DIAGNOSIS — D6862 Lupus anticoagulant syndrome: Secondary | ICD-10-CM

## 2021-09-11 DIAGNOSIS — S72041A Displaced fracture of base of neck of right femur, initial encounter for closed fracture: Secondary | ICD-10-CM

## 2021-09-11 DIAGNOSIS — S72001A Fracture of unspecified part of neck of right femur, initial encounter for closed fracture: Secondary | ICD-10-CM | POA: Diagnosis not present

## 2021-09-11 HISTORY — PX: TOTAL HIP ARTHROPLASTY: SHX124

## 2021-09-11 LAB — CBC WITH DIFFERENTIAL/PLATELET
Abs Immature Granulocytes: 0.03 10*3/uL (ref 0.00–0.07)
Basophils Absolute: 0 10*3/uL (ref 0.0–0.1)
Basophils Relative: 0 %
Eosinophils Absolute: 0.1 10*3/uL (ref 0.0–0.5)
Eosinophils Relative: 1 %
HCT: 39.1 % (ref 36.0–46.0)
Hemoglobin: 12.9 g/dL (ref 12.0–15.0)
Immature Granulocytes: 0 %
Lymphocytes Relative: 13 %
Lymphs Abs: 0.9 10*3/uL (ref 0.7–4.0)
MCH: 29.1 pg (ref 26.0–34.0)
MCHC: 33 g/dL (ref 30.0–36.0)
MCV: 88.3 fL (ref 80.0–100.0)
Monocytes Absolute: 0.4 10*3/uL (ref 0.1–1.0)
Monocytes Relative: 7 %
Neutro Abs: 5.3 10*3/uL (ref 1.7–7.7)
Neutrophils Relative %: 79 %
Platelets: 226 10*3/uL (ref 150–400)
RBC: 4.43 MIL/uL (ref 3.87–5.11)
RDW: 14.6 % (ref 11.5–15.5)
WBC: 6.7 10*3/uL (ref 4.0–10.5)
nRBC: 0 % (ref 0.0–0.2)

## 2021-09-11 LAB — COMPREHENSIVE METABOLIC PANEL
ALT: 15 U/L (ref 0–44)
AST: 19 U/L (ref 15–41)
Albumin: 4 g/dL (ref 3.5–5.0)
Alkaline Phosphatase: 54 U/L (ref 38–126)
Anion gap: 6 (ref 5–15)
BUN: 12 mg/dL (ref 8–23)
CO2: 24 mmol/L (ref 22–32)
Calcium: 9 mg/dL (ref 8.9–10.3)
Chloride: 108 mmol/L (ref 98–111)
Creatinine, Ser: 0.78 mg/dL (ref 0.44–1.00)
GFR, Estimated: >60 mL/min (ref >60)
Glucose, Bld: 107 mg/dL — ABNORMAL HIGH (ref 70–99)
Potassium: 4.2 mmol/L (ref 3.5–5.1)
Sodium: 138 mmol/L (ref 135–145)
Total Bilirubin: 1.8 mg/dL — ABNORMAL HIGH (ref 0.3–1.2)
Total Protein: 6.6 g/dL (ref 6.5–8.1)

## 2021-09-11 LAB — SURGICAL PCR SCREEN
MRSA, PCR: NEGATIVE
Staphylococcus aureus: NEGATIVE

## 2021-09-11 LAB — VITAMIN D 25 HYDROXY (VIT D DEFICIENCY, FRACTURES): Vit D, 25-Hydroxy: 26.58 ng/mL — ABNORMAL LOW (ref 30–100)

## 2021-09-11 SURGERY — ARTHROPLASTY, HIP, TOTAL, ANTERIOR APPROACH
Anesthesia: Spinal | Site: Hip | Laterality: Right

## 2021-09-11 MED ORDER — ORAL CARE MOUTH RINSE: 15.0000 mL | OROMUCOSAL | Status: DC | PRN

## 2021-09-11 MED ORDER — LACTATED RINGERS IV SOLN: INTRAVENOUS | Status: DC

## 2021-09-11 MED ORDER — FENTANYL CITRATE (PF) 250 MCG/5ML IJ SOLN
INTRAMUSCULAR | Status: AC
  Filled 2021-09-11: qty 5

## 2021-09-11 MED ORDER — POLYETHYLENE GLYCOL 3350 17 G PO PACK
17.0000 g | PACK | Freq: Every day | ORAL | Status: DC | PRN
  Administered 2021-09-12: 17 g via ORAL

## 2021-09-11 MED ORDER — METOCLOPRAMIDE HCL 5 MG PO TABS: 5.0000 mg | ORAL_TABLET | Freq: Three times a day (TID) | ORAL | Status: DC | PRN

## 2021-09-11 MED ORDER — PROPOFOL 500 MG/50ML IV EMUL
INTRAVENOUS | Status: DC | PRN
  Administered 2021-09-11: 100 ug/kg/min via INTRAVENOUS

## 2021-09-11 MED ORDER — PHENYLEPHRINE 80 MCG/ML (10ML) SYRINGE FOR IV PUSH (FOR BLOOD PRESSURE SUPPORT)
PREFILLED_SYRINGE | INTRAVENOUS | Status: AC
  Filled 2021-09-11: qty 10

## 2021-09-11 MED ORDER — PHENYLEPHRINE HCL-NACL 20-0.9 MG/250ML-% IV SOLN
INTRAVENOUS | Status: DC | PRN
  Administered 2021-09-11: 50 ug/min via INTRAVENOUS

## 2021-09-11 MED ORDER — APIXABAN 2.5 MG PO TABS
2.5000 mg | ORAL_TABLET | Freq: Two times a day (BID) | ORAL | Status: DC
  Administered 2021-09-12: 2.5 mg via ORAL
  Filled 2021-09-11: qty 1

## 2021-09-11 MED ORDER — TRANEXAMIC ACID-NACL 1000-0.7 MG/100ML-% IV SOLN
1000.0000 mg | INTRAVENOUS | Status: AC
  Administered 2021-09-11: 1000 mg via INTRAVENOUS
  Filled 2021-09-11: qty 100

## 2021-09-11 MED ORDER — CHLORHEXIDINE GLUCONATE 0.12 % MT SOLN
15.0000 mL | Freq: Once | OROMUCOSAL | Status: AC
Start: 2021-09-11 — End: 2021-09-11

## 2021-09-11 MED ORDER — METHOCARBAMOL 500 MG PO TABS: 500.0000 mg | ORAL_TABLET | Freq: Four times a day (QID) | ORAL | Status: DC | PRN

## 2021-09-11 MED ORDER — HYDROMORPHONE HCL 1 MG/ML IJ SOLN: 0.2500 mg | INTRAMUSCULAR | Status: DC | PRN

## 2021-09-11 MED ORDER — OXYCODONE HCL 5 MG PO TABS
5.0000 mg | ORAL_TABLET | ORAL | Status: DC | PRN
  Administered 2021-09-12 – 2021-09-13 (×4): 5 mg via ORAL
  Filled 2021-09-11 (×4): qty 1

## 2021-09-11 MED ORDER — 0.9 % SODIUM CHLORIDE (POUR BTL) OPTIME
TOPICAL | Status: DC | PRN
  Administered 2021-09-11: 1000 mL

## 2021-09-11 MED ORDER — MENTHOL 3 MG MT LOZG: 1.0000 | LOZENGE | OROMUCOSAL | Status: DC | PRN

## 2021-09-11 MED ORDER — PROMETHAZINE HCL 25 MG/ML IJ SOLN: 6.2500 mg | INTRAMUSCULAR | Status: DC | PRN

## 2021-09-11 MED ORDER — DOCUSATE SODIUM 100 MG PO CAPS
100.0000 mg | ORAL_CAPSULE | Freq: Two times a day (BID) | ORAL | Status: DC
  Administered 2021-09-12 – 2021-09-13 (×3): 100 mg via ORAL
  Filled 2021-09-11 (×4): qty 1

## 2021-09-11 MED ORDER — ORAL CARE MOUTH RINSE
15.0000 mL | Freq: Once | OROMUCOSAL | Status: AC
Start: 2021-09-11 — End: 2021-09-11

## 2021-09-11 MED ORDER — CEFAZOLIN SODIUM-DEXTROSE 2-4 GM/100ML-% IV SOLN
2.0000 g | INTRAVENOUS | Status: AC
  Administered 2021-09-11: 2 g via INTRAVENOUS
  Filled 2021-09-11: qty 100

## 2021-09-11 MED ORDER — ACETAMINOPHEN 325 MG PO TABS: 325.0000 mg | ORAL_TABLET | Freq: Once | ORAL | Status: DC | PRN

## 2021-09-11 MED ORDER — HYDROMORPHONE HCL 1 MG/ML IJ SOLN
0.5000 mg | INTRAMUSCULAR | Status: DC | PRN
  Filled 2021-09-11: qty 0.5

## 2021-09-11 MED ORDER — AMISULPRIDE (ANTIEMETIC) 5 MG/2ML IV SOLN: 10.0000 mg | Freq: Once | INTRAVENOUS | Status: DC | PRN

## 2021-09-11 MED ORDER — PROPOFOL 10 MG/ML IV BOLUS
INTRAVENOUS | Status: DC | PRN
  Administered 2021-09-11: 20 mg via INTRAVENOUS
  Administered 2021-09-11: 30 mg via INTRAVENOUS
  Administered 2021-09-11: 20 mg via INTRAVENOUS

## 2021-09-11 MED ORDER — ONDANSETRON HCL 4 MG PO TABS: 4.0000 mg | ORAL_TABLET | Freq: Four times a day (QID) | ORAL | Status: DC | PRN

## 2021-09-11 MED ORDER — SODIUM CHLORIDE 0.9 % IV SOLN: INTRAVENOUS | Status: DC

## 2021-09-11 MED ORDER — FENTANYL CITRATE (PF) 250 MCG/5ML IJ SOLN
INTRAMUSCULAR | Status: DC | PRN
  Administered 2021-09-11 (×2): 25 ug via INTRAVENOUS

## 2021-09-11 MED ORDER — PROPOFOL 10 MG/ML IV BOLUS
INTRAVENOUS | Status: AC
  Filled 2021-09-11: qty 20

## 2021-09-11 MED ORDER — ACETAMINOPHEN 325 MG PO TABS: 325.0000 mg | ORAL_TABLET | Freq: Four times a day (QID) | ORAL | Status: DC | PRN

## 2021-09-11 MED ORDER — BUPIVACAINE LIPOSOME 1.3 % IJ SUSP
INTRAMUSCULAR | Status: DC | PRN
  Administered 2021-09-11: 10 mL

## 2021-09-11 MED ORDER — CHLORHEXIDINE GLUCONATE 4 % EX LIQD
60.0000 mL | Freq: Once | CUTANEOUS | Status: AC
Start: 2021-09-11 — End: 2021-09-11
  Administered 2021-09-11: 4 via TOPICAL
  Filled 2021-09-11: qty 60

## 2021-09-11 MED ORDER — PHENOL 1.4 % MT LIQD: 1.0000 | OROMUCOSAL | Status: DC | PRN

## 2021-09-11 MED ORDER — POVIDONE-IODINE 10 % EX SWAB
2.0000 | Freq: Once | CUTANEOUS | Status: AC
Start: 2021-09-11 — End: 2021-09-11
  Administered 2021-09-11: 2 via TOPICAL

## 2021-09-11 MED ORDER — CHLORHEXIDINE GLUCONATE 0.12 % MT SOLN
OROMUCOSAL | Status: AC
  Administered 2021-09-11: 15 mL via OROMUCOSAL
  Filled 2021-09-11: qty 15

## 2021-09-11 MED ORDER — ONDANSETRON HCL 4 MG/2ML IJ SOLN: 4.0000 mg | Freq: Four times a day (QID) | INTRAMUSCULAR | Status: DC | PRN

## 2021-09-11 MED ORDER — ONDANSETRON HCL 4 MG/2ML IJ SOLN
INTRAMUSCULAR | Status: DC | PRN
  Administered 2021-09-11: 4 mg via INTRAVENOUS

## 2021-09-11 MED ORDER — BUPIVACAINE HCL 0.5 % IJ SOLN
INTRAMUSCULAR | Status: DC | PRN
  Administered 2021-09-11: 10 mL

## 2021-09-11 MED ORDER — ACETAMINOPHEN 160 MG/5ML PO SOLN: 325.0000 mg | Freq: Once | ORAL | Status: DC | PRN

## 2021-09-11 MED ORDER — BUPIVACAINE IN DEXTROSE 0.75-8.25 % IT SOLN
INTRATHECAL | Status: DC | PRN
  Administered 2021-09-11: 2 mL via INTRATHECAL

## 2021-09-11 MED ORDER — PHENYLEPHRINE 80 MCG/ML (10ML) SYRINGE FOR IV PUSH (FOR BLOOD PRESSURE SUPPORT)
PREFILLED_SYRINGE | INTRAVENOUS | Status: DC | PRN
  Administered 2021-09-11: 160 ug via INTRAVENOUS

## 2021-09-11 MED ORDER — METOCLOPRAMIDE HCL 5 MG/ML IJ SOLN: 5.0000 mg | Freq: Three times a day (TID) | INTRAMUSCULAR | Status: DC | PRN

## 2021-09-11 MED ORDER — METHOCARBAMOL 1000 MG/10ML IJ SOLN: 500.0000 mg | Freq: Four times a day (QID) | INTRAVENOUS | Status: DC | PRN

## 2021-09-11 MED ORDER — MEPERIDINE HCL 25 MG/ML IJ SOLN: 6.2500 mg | INTRAMUSCULAR | Status: DC | PRN

## 2021-09-11 MED ORDER — ACETAMINOPHEN 10 MG/ML IV SOLN: 1000.0000 mg | Freq: Once | INTRAVENOUS | Status: DC | PRN

## 2021-09-11 SURGICAL SUPPLY — 47 items
APL SKNCLS STERI-STRIP NONHPOA (GAUZE/BANDAGES/DRESSINGS) ×1
ARTICULEZE HEAD (Hips) ×1 IMPLANT
BENZOIN TINCTURE PRP APPL 2/3 (GAUZE/BANDAGES/DRESSINGS) ×1 IMPLANT
BLADE SAW SGTL 18X1.27X75 (BLADE) ×1 IMPLANT
COVER PERINEAL POST (MISCELLANEOUS) ×1 IMPLANT
COVER SURGICAL LIGHT HANDLE (MISCELLANEOUS) ×1 IMPLANT
DRAPE C-ARM 42X72 X-RAY (DRAPES) ×1 IMPLANT
DRAPE STERI IOBAN 125X83 (DRAPES) ×1 IMPLANT
DRAPE U-SHAPE 47X51 STRL (DRAPES) ×3 IMPLANT
DRSG MEPILEX BORDER 4X12 (GAUZE/BANDAGES/DRESSINGS) IMPLANT
DURAPREP 26ML APPLICATOR (WOUND CARE) ×1 IMPLANT
ELECT CAUTERY BLADE 6.4 (BLADE) ×1 IMPLANT
ELECT REM PT RETURN 9FT ADLT (ELECTROSURGICAL) ×1
ELECTRODE REM PT RTRN 9FT ADLT (ELECTROSURGICAL) ×1 IMPLANT
ELIMINATOR HOLE APEX DEPUY (Hips) IMPLANT
FACESHIELD WRAPAROUND (MASK) ×3 IMPLANT
FACESHIELD WRAPAROUND OR TEAM (MASK) ×2 IMPLANT
GLOVE BIOGEL PI IND STRL 8 (GLOVE) ×2 IMPLANT
GLOVE BIOGEL PI INDICATOR 8 (GLOVE) ×2
GLOVE ORTHO TXT STRL SZ7.5 (GLOVE) ×2 IMPLANT
GOWN STRL REUS W/ TWL LRG LVL3 (GOWN DISPOSABLE) ×1 IMPLANT
GOWN STRL REUS W/ TWL XL LVL3 (GOWN DISPOSABLE) ×1 IMPLANT
GOWN STRL REUS W/TWL 2XL LVL3 (GOWN DISPOSABLE) ×1 IMPLANT
GOWN STRL REUS W/TWL LRG LVL3 (GOWN DISPOSABLE) ×1
GOWN STRL REUS W/TWL XL LVL3 (GOWN DISPOSABLE) ×1
HEAD ARTICULEZE (Hips) IMPLANT
KIT BASIN OR (CUSTOM PROCEDURE TRAY) ×1 IMPLANT
KIT TURNOVER KIT B (KITS) ×1 IMPLANT
LINER NEUTRAL 52X36MM PLUS 4 (Liner) IMPLANT
MANIFOLD NEPTUNE II (INSTRUMENTS) ×1 IMPLANT
NDL HYPO 21X1 ECLIPSE (NEEDLE) ×1 IMPLANT
NEEDLE HYPO 21X1 ECLIPSE (NEEDLE) ×1 IMPLANT
NS IRRIG 1000ML POUR BTL (IV SOLUTION) ×1 IMPLANT
PACK TOTAL JOINT (CUSTOM PROCEDURE TRAY) ×1 IMPLANT
PAD ARMBOARD 7.5X6 YLW CONV (MISCELLANEOUS) ×2 IMPLANT
PIN SECTOR W/GRIP ACE CUP 52MM (Hips) IMPLANT
STAPLER VISISTAT 35W (STAPLE) IMPLANT
STEM FEM ACTIS STD SZ4 (Stem) IMPLANT
SUT VIC AB 0 CT1 27 (SUTURE) ×1
SUT VIC AB 0 CT1 27XBRD ANBCTR (SUTURE) ×1 IMPLANT
SUT VIC AB 2-0 CT1 27 (SUTURE) ×1
SUT VIC AB 2-0 CT1 TAPERPNT 27 (SUTURE) ×1 IMPLANT
SUT VLOC 180 0 24IN GS25 (SUTURE) ×1 IMPLANT
SYR 20CC LL (SYRINGE) ×1 IMPLANT
TOWEL GREEN STERILE FF (TOWEL DISPOSABLE) ×1 IMPLANT
TRAY FOLEY MTR SLVR 16FR STAT (SET/KITS/TRAYS/PACK) ×1 IMPLANT
WATER STERILE IRR 1000ML POUR (IV SOLUTION) ×2 IMPLANT

## 2021-09-11 NOTE — Anesthesia Procedure Notes (Addendum)
Spinal  Start time: 09/11/2021 3:09 PM End time: 09/11/2021 3:11 PM Reason for block: surgical anesthesia Staffing Performed: anesthesiologist  Anesthesiologist: Effie Berkshire, MD Performed by: Effie Berkshire, MD Authorized by: Effie Berkshire, MD   Preanesthetic Checklist Completed: patient identified, IV checked, site marked, risks and benefits discussed, surgical consent, monitors and equipment checked, pre-op evaluation and timeout performed Spinal Block Patient position: left lateral decubitus Prep: DuraPrep and site prepped and draped Location: L3-4 Injection technique: single-shot Needle Needle type: Pencan  Needle gauge: 24 G Needle length: 10 cm Needle insertion depth: 10 cm Additional Notes Patient tolerated well. No immediate complications.  Functioning IV was confirmed and monitors were applied. Sterile prep and drape, including hand hygiene and sterile gloves were used. The patient was positioned and the back was prepped. The skin was anesthetized with lidocaine. Free flow of clear CSF was obtained prior to injecting local anesthetic into the CSF. The spinal needle aspirated freely following injection. The needle was carefully withdrawn. The patient tolerated the procedure well.

## 2021-09-11 NOTE — Interval H&P Note (Signed)
History and Physical Interval Note:  09/11/2021 2:12 PM  Kristin Mills  has presented today for surgery, with the diagnosis of right femoral neck fracture.  The various methods of treatment have been discussed with the patient and family. After consideration of risks, benefits and other options for treatment, the patient has consented to  Procedure(s): TOTAL HIP ARTHROPLASTY ANTERIOR APPROACH (Right) as a surgical intervention.  The patient's history has been reviewed, patient examined, no change in status, stable for surgery.  I have reviewed the patient's chart and labs.  Questions were answered to the patient's satisfaction.     Kristin Mills

## 2021-09-11 NOTE — Assessment & Plan Note (Signed)
In remision

## 2021-09-11 NOTE — Assessment & Plan Note (Signed)
amlodipine 

## 2021-09-11 NOTE — Transfer of Care (Signed)
Immediate Anesthesia Transfer of Care Note  Patient: Kristin Mills  Procedure(s) Performed: RIGHT TOTAL HIP ARTHROPLASTY ANTERIOR APPROACH (Right: Hip)  Patient Location: PACU  Anesthesia Type:Spinal  Level of Consciousness: awake, alert  and oriented  Airway & Oxygen Therapy: Patient Spontanous Breathing  Post-op Assessment: Report given to RN  Post vital signs: Reviewed and stable  Last Vitals:  Vitals Value Taken Time  BP 82/53 09/11/21 1644  Temp    Pulse 71 09/11/21 1644  Resp 16 09/11/21 1644  SpO2 95 % 09/11/21 1644  Vitals shown include unvalidated device data.  Last Pain:  Vitals:   09/11/21 1444  TempSrc:   PainSc: 3       Patients Stated Pain Goal: 0 (22/56/72 0919)  Complications: No notable events documented.

## 2021-09-11 NOTE — Assessment & Plan Note (Signed)
Discussed with hematology who recommend 6-8 weeks full dose anticoagulation with eliquis post op

## 2021-09-11 NOTE — Anesthesia Procedure Notes (Signed)
Procedure Name: MAC Date/Time: 09/11/2021 3:05 PM  Performed by: Barrington Ellison, CRNAPre-anesthesia Checklist: Patient identified, Emergency Drugs available, Suction available, Patient being monitored and Timeout performed Patient Re-evaluated:Patient Re-evaluated prior to induction Oxygen Delivery Method: Simple face mask

## 2021-09-11 NOTE — Progress Notes (Addendum)
PROGRESS NOTE    Kristin Mills  HYI:[id number] DOB: 1956-01-12 DOA: 09/10/2021 PCP: [name], DO  Chief Complaint  Patient presents with   Fall   Hip Pain    Brief Narrative:  Kristin Mills is Kristin Mills 66 y.o. female with medical history significant of  breast cancer in remission,  high cholesterol, hypertension, hx of Mitral valve prolapse who presented after Kristin Mills mechanical fall and was found to have Kristin Mills R hip fracture.  Plan for surgery today per ortho.  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[place])   Assessment and Plan: * Closed displaced fracture of right femoral neck (HCC) Plain films with acute right femoral neck fracture with impaction and proximal displacement Mechanical fall Likely >4 mets Plan for surgery today per orthopedics Post op PT/OT Pain meds, bowel regimen per ortho She'll need anticoagulation with eliquis given hx lupus anticoagulant, see below   Lupus anticoagulant disorder [facility]) Discussed with hematology who recommend 6-8 weeks full dose anticoagulation with eliquis post op  Hyperlipidemia simvastatin  Hypertension amlodipine  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[place]) In remision          DVT prophylaxis: SCD, post op eliquis Code Status: full Family Communication: husband at bedside Disposition:   Status is: Inpatient Remains inpatient appropriate because: pending orthopedic surgery, therapy   Consultants:  ortho  Procedures:  pending  Antimicrobials:  Anti-infectives (From admission, onward)    Start     Dose/Rate Route Frequency Ordered Stop   09/11/21 1445  ceFAZolin (ANCEF) IVPB 2g/100 mL premix        2 g 200 mL/hr over 30 Minutes Intravenous To  [facility] 09/11/21 0746 09/11/21 1515       Subjective: No new complaints, does note some pain   Objective: Vitals:   09/11/21 0100 09/11/21 0502 09/11/21 0755 09/11/21 1410  BP: 115/67 132/69 128/77 131/79  Pulse: 77 79  78  Resp: '16 16 18 20  '$ Temp: 98.4 F (36.9 C)  98.7 F (37.1 C) 98.1 F (36.7 C)  TempSrc: Oral  Oral Oral  SpO2: 96% 96% 96% 95%  Weight:    78.9 kg  Height:    '5\' 8"'$  (1.727 m)    Intake/Output Summary (Last 24 hours) at 09/11/2021 1619 Last data filed at 09/11/2021 1604 Gross per 24 hour  Intake 126.71 ml  Output 1100 ml  Net -973.29 ml   Filed Weights   09/11/21 1410  Weight: 78.9 kg    Examination:  General exam: Appears calm and comfortable  Respiratory system: unlabored Cardiovascular system: RRR Gastrointestinal system: Abdomen is nondistended, soft and nontender.  Central nervous system: Alert and oriented. No focal neurological deficits. Extremities: RLE with swelling, ice pack in place   Data Reviewed: I have personally reviewed following labs and imaging studies  CBC: Recent Labs  Lab 09/10/21 1257 09/11/21 1032  WBC 7.3 6.7  NEUTROABS 5.5 5.3  HGB 13.4 12.9  HCT 39.5 39.1  MCV 86.2 88.3  PLT 233 786    Basic Metabolic Panel: Recent Labs  Lab 09/10/21 1257 09/10/21 2003 09/11/21 1032  NA 138  --  138  K 3.8  --  4.2  CL 104  --  108  CO2 24  --  24  GLUCOSE 111*  --  107*  BUN 21  --  12  CREATININE 0.88  --  0.78  CALCIUM 9.3  --  9.0  MG  --  2.1  --   PHOS  --  3.8  --     GFR: Estimated Creatinine Clearance: 74.3 mL/min (by C-G formula based on SCr of 0.78 mg/dL).  Liver Function Tests: Recent Labs  Lab 09/11/21 1032  AST 19  ALT 15  ALKPHOS 54  BILITOT 1.8*  PROT 6.6  ALBUMIN 4.0    CBG: No results for input(s): "GLUCAP" in the last 168 hours.   Recent Results (from the past 240 hour(s))  Surgical pcr screen     Status: None   Collection Time: 09/11/21  1:29 AM   Specimen:  Nasal Mucosa; Nasal Swab  Result Value Ref Range Status   MRSA, PCR NEGATIVE NEGATIVE Final   Staphylococcus aureus NEGATIVE NEGATIVE Final    Comment: (NOTE) The Xpert SA Assay (FDA approved for NASAL specimens in patients 38 years of age and older), is one component of Nini Cavan comprehensive surveillance program. It is not intended to diagnose infection nor to guide or monitor treatment. Performed at [facility], [place] [address], [place], [place] [address]          Radiology Studies: DG Chest Port 1 View  Result Date: 09/10/2021 CLINICAL DATA:  pre-op right femoral neck fracture EXAM: PORTABLE CHEST 1 VIEW COMPARISON:  None Available. FINDINGS: The heart size and mediastinal contours are within normal limits. Both lungs are clear. No pleural effusion. The visualized skeletal structures are unremarkable. IMPRESSION: No active disease. Electronically Signed   By: Macy Mis M.D.   On: 09/10/2021 13:30   DG Knee 1-2 Views Right  Result Date: 09/10/2021 CLINICAL DATA:  Fall, pain, initial encounter. EXAM: RIGHT KNEE - 1-2 VIEW COMPARISON:  None Available. FINDINGS: No joint effusion or fracture.  Trace patellofemoral osteophytosis. IMPRESSION: 1. No acute findings. 2. Trace patellofemoral osteophytosis. Electronically Signed   By: Lorin Picket M.D.   On: 09/10/2021 13:26   DG Hip Unilat W or Wo Pelvis 2-3 Views Right  Result Date: 09/10/2021 CLINICAL DATA:  swelling EXAM: DG HIP (WITH OR WITHOUT PELVIS) 2-3V RIGHT COMPARISON:  None Available. FINDINGS: Acute right femoral neck fracture with impaction and proximal displacement. Femoral head appears located. Surrounding soft tissue swelling. IMPRESSION: Acute right femoral neck fracture with impaction and proximal displacement. Electronically Signed   By: Margaretha Sheffield M.D.   On: 09/10/2021 12:30        Scheduled Meds:  [MAR Hold] amLODipine  5 mg Oral Daily   [MAR Hold] simvastatin  10 mg Oral Daily   Continuous  Infusions:  sodium chloride 50 mL/hr at 09/11/21 0827   lactated ringers 10 mL/hr at 09/11/21 1447     LOS: 1 day    Time spent: over 30 min    Fayrene Helper, MD [facility]   To contact the attending provider between 7A-7P or the covering provider during after hours 7P-7A, please log into the web site [email or web address] and access using universal [place] password for that web site. If you do not have the password, please call the hospital operator.  09/11/2021, 4:19 PM

## 2021-09-11 NOTE — Progress Notes (Signed)
Orthopedic Tech Progress Note Patient Details:  Kristin Mills 1955/04/08 [id number]  Patient ID: Kristin Mills, female   DOB: 09/08/1955, 66 y.o.   MRN: [id number] Overhead frame with trapeze delivered and applied to pt's bed. Maxie Debose OTR/L 09/11/2021, 8:21 AM

## 2021-09-11 NOTE — Anesthesia Preprocedure Evaluation (Addendum)
Anesthesia Evaluation  Patient identified by MRN, date of birth, ID band Patient awake    Reviewed: Allergy & Precautions, NPO status , Patient's Chart, lab work & pertinent test results  Airway Mallampati: II  TM Distance: >3 FB Neck ROM: Full    Dental  (+) Teeth Intact, Dental Advisory Given   Pulmonary neg pulmonary ROS,    breath sounds clear to auscultation       Cardiovascular hypertension, Pt. on medications  Rhythm:Regular Rate:Normal     Neuro/Psych  Headaches, PSYCHIATRIC DISORDERS Anxiety    GI/Hepatic negative GI ROS, Neg liver ROS,   Endo/Other  negative endocrine ROS  Renal/GU negative Renal ROS     Musculoskeletal negative musculoskeletal ROS (+)   Abdominal Normal abdominal exam  (+)   Peds  Hematology negative hematology ROS (+)   Anesthesia Other Findings   Reproductive/Obstetrics                            Anesthesia Physical Anesthesia Plan  ASA: 2  Anesthesia Plan: Spinal   Post-op Pain Management:    Induction: Intravenous  PONV Risk Score and Plan: 3 and Ondansetron, Propofol infusion and Midazolam  Airway Management Planned: Simple Face Mask and Natural Airway  Additional Equipment: None  Intra-op Plan:   Post-operative Plan:   Informed Consent: I have reviewed the patients History and Physical, chart, labs and discussed the procedure including the risks, benefits and alternatives for the proposed anesthesia with the patient or authorized representative who has indicated his/her understanding and acceptance.       Plan Discussed with: CRNA  Anesthesia Plan Comments: (Lab Results      Component                Value               Date                      WBC                      6.7                 09/11/2021                HGB                      12.9                09/11/2021                HCT                      39.1                 09/11/2021                MCV                      88.3                09/11/2021                PLT                      226  09/11/2021           )       Anesthesia Quick Evaluation

## 2021-09-11 NOTE — Assessment & Plan Note (Addendum)
Plain films with acute right femoral neck fracture with impaction and proximal displacement Mechanical fall Likely >4 mets S/p right total hip arthroplasty, direct anterior approach 8/23 Post op PT/OT Pain meds, bowel regimen per ortho Needs outpatient follow up with PCP for management of osteoporosis  She'll need anticoagulation with eliquis given hx lupus anticoagulant, see below - 6-8 weeks

## 2021-09-11 NOTE — Consult Note (Signed)
Reason for Consult:fall with right femoral neck fracture Referring Physician: Fayrene Helper, MD  Kristin Mills is an 66 y.o. female.  HPI: 66 year old female was had her exercise class cardio and was an warm up phase going side to side and felt.  She thinks her shoes caught she fell on the right side and had immediate right hip pain and inability to ambulate.  X-rays demonstrated a displaced right femoral neck fracture.  Patient's had past history of breast cancer 10 years ago .  No history of metastatic disease.  Additionally she has some hypertension and elevated cholesterol but is active and healthy.  Past surgical history includes hysterectomy ganglion excision on the right.  Past Medical History:  Diagnosis Date   Breast cancer ([place]) 12/09/11   DCIS right breast, ER+   Glaucoma, anatomical narrow angle    treated with laser surgery at [facility]   Headache(784.0)    sinus   History of radiation therapy 01/12/12 -02/11/12   right breast   Hypercholesterolemia    Lupus anticoagulant positive    Personal history of radiation therapy    Skin cancer    basal cell carcinoma; left side of mouth; removed bu Dr. Allyson Sabal in office    Past Surgical History:  Procedure Laterality Date   ABDOMINAL HYSTERECTOMY  1999   partial; left ovaries   BREAST LUMPECTOMY Right 2013   BREAST LUMPECTOMY WITH NEEDLE LOCALIZATION AND AXILLARY SENTINEL LYMPH NODE BX  12/09/2011   Procedure: BREAST LUMPECTOMY WITH NEEDLE LOCALIZATION AND AXILLARY SENTINEL LYMPH NODE BX;  Surgeon: Shann Medal, MD;  Location: [facility];  Service: General;  Laterality: Right;   BREAST SURGERY Right 2013   DILATION AND CURETTAGE OF UTERUS     EYE SURGERY     laser over 10 years both eyes   GANGLION CYST EXCISION     right   PERIPHERAL IRIDOTOMY  2009   bilateral    Family History  Problem Relation Age of Onset   Skin cancer Mother    Lung cancer Father    Lung cancer Maternal Uncle    Lung cancer Paternal Uncle      Social History:  reports that she has never smoked. She has never used smokeless tobacco. She reports current alcohol use of about 3.0 standard drinks of alcohol per week. She reports that she does not use drugs.  Allergies: No Known Allergies  Medications: I have reviewed the patient's current medications.  Results for orders placed or performed during the hospital encounter of 09/10/21 (from the past 48 hour(s))  Basic metabolic panel     Status: Abnormal   Collection Time: 09/10/21 12:57 PM  Result Value Ref Range   Sodium 138 135 - 145 mmol/L   Potassium 3.8 3.5 - 5.1 mmol/L   Chloride 104 98 - 111 mmol/L   CO2 24 22 - 32 mmol/L   Glucose, Bld 111 (H) 70 - 99 mg/dL    Comment: Glucose reference range applies only to samples taken after fasting for at least 8 hours.   BUN 21 8 - 23 mg/dL   Creatinine, Ser 0.88 0.44 - 1.00 mg/dL   Calcium 9.3 8.9 - 10.3 mg/dL   GFR, Estimated >60 >60 mL/min    Comment: (NOTE) Calculated using the CKD-EPI Creatinine Equation (2021)    Anion gap 10 5 - 15    Comment: Performed at [facility], [place], [place] [address]  CBC WITH DIFFERENTIAL  Status: None   Collection Time: 09/10/21 12:57 PM  Result Value Ref Range   WBC 7.3 4.0 - 10.5 K/uL   RBC 4.58 3.87 - 5.11 MIL/uL   Hemoglobin 13.4 12.0 - 15.0 g/dL   HCT 39.5 36.0 - 46.0 %   MCV 86.2 80.0 - 100.0 fL   MCH 29.3 26.0 - 34.0 pg   MCHC 33.9 30.0 - 36.0 g/dL   RDW 14.5 11.5 - 15.5 %   Platelets 233 150 - 400 K/uL   nRBC 0.0 0.0 - 0.2 %   Neutrophils Relative % 77 %   Neutro Abs 5.5 1.7 - 7.7 K/uL   Lymphocytes Relative 17 %   Lymphs Abs 1.3 0.7 - 4.0 K/uL   Monocytes Relative 5 %   Monocytes Absolute 0.4 0.1 - 1.0 K/uL   Eosinophils Relative 1 %   Eosinophils Absolute 0.1 0.0 - 0.5 K/uL   Basophils Relative 0 %   Basophils Absolute 0.0 0.0 - 0.1 K/uL   Immature Granulocytes 0 %   Abs Immature Granulocytes 0.03 0.00 - 0.07 K/uL     Comment: Performed at [facility], [address] [place], [place] [address]  Protime-INR     Status: None   Collection Time: 09/10/21 12:57 PM  Result Value Ref Range   Prothrombin Time 12.4 11.4 - 15.2 seconds   INR 0.9 0.8 - 1.2    Comment: (NOTE) INR goal varies based on device and disease states. Performed at [facility], [address], [place], [place] [address]   CK     Status: None   Collection Time: 09/10/21  8:03 PM  Result Value Ref Range   Total CK 116 38 - 234 U/L    Comment: Performed at [facility], [place] [address], [place], [place] [address]  Magnesium     Status: None   Collection Time: 09/10/21  8:03 PM  Result Value Ref Range   Magnesium 2.1 1.7 - 2.4 mg/dL    Comment: Performed at [facility], [place] [address] [place], [place] [address]  Phosphorus     Status: None   Collection Time: 09/10/21  8:03 PM  Result Value Ref Range   Phosphorus 3.8 2.5 - 4.6 mg/dL    Comment: Performed at [facility], [place] [address] [place], [place] [address]  HIV Antibody (routine testing w rflx)     Status: None   Collection Time: 09/10/21  8:03 PM  Result Value Ref Range   HIV Screen 4th Generation wRfx Non Reactive Non Reactive    Comment: Performed at [facility], [place] [address] [place], [place] [address]  Type and screen     Status: None   Collection Time: 09/10/21  8:03 PM  Result Value Ref Range   ABO/RH(D) O POS    Antibody Screen NEG    Sample Expiration      09/13/2021,2359 Performed at [facility], [place] [address], [place],  [address]   ABO/Rh     Status: None   Collection Time: 09/10/21 11:18 PM  Result Value Ref Range   ABO/RH(D)      O POS Performed at [place] [address], [place],  [address]   Surgical pcr screen     Status: None   Collection Time: 09/11/21  1:29 AM   Specimen: Nasal Mucosa; Nasal Swab  Result Value Ref Range   MRSA, PCR NEGATIVE  NEGATIVE  Staphylococcus aureus NEGATIVE NEGATIVE    Comment: (NOTE) The Xpert SA Assay (FDA approved for NASAL specimens in patients 62 years of age and older), is one component of a comprehensive surveillance program. It is not intended to diagnose infection nor to guide or monitor treatment. Performed at [facility], [place] [address] [place],  [address]     DG Chest Port 1 View  Result Date: 09/10/2021 CLINICAL DATA:  pre-op right femoral neck fracture EXAM: PORTABLE CHEST 1 VIEW COMPARISON:  None Available. FINDINGS: The heart size and mediastinal contours are within normal limits. Both lungs are clear. No pleural effusion. The visualized skeletal structures are unremarkable. IMPRESSION: No active disease. Electronically Signed   By: Macy Mis M.D.   On: 09/10/2021 13:30   DG Knee 1-2 Views Right  Result Date: 09/10/2021 CLINICAL DATA:  Fall, pain, initial encounter. EXAM: RIGHT KNEE - 1-2 VIEW COMPARISON:  None Available. FINDINGS: No joint effusion or fracture.  Trace patellofemoral osteophytosis. IMPRESSION: 1. No acute findings. 2. Trace patellofemoral osteophytosis. Electronically Signed   By: Lorin Picket M.D.   On: 09/10/2021 13:26   DG Hip Unilat W or Wo Pelvis 2-3 Views Right  Result Date: 09/10/2021 CLINICAL DATA:  swelling EXAM: DG HIP (WITH OR WITHOUT PELVIS) 2-3V RIGHT COMPARISON:  None Available. FINDINGS: Acute right femoral neck fracture with impaction and proximal displacement. Femoral head appears located. Surrounding soft tissue swelling. IMPRESSION: Acute right femoral neck fracture with impaction and proximal displacement. Electronically Signed   By: Margaretha Sheffield M.D.   On: 09/10/2021 12:30    ROS no history of anesthetic or bleeding problems all other systems 14 point update noncontributory other than as mentioned in HPI. Blood pressure 132/69, pulse 79, temperature 98.4 F (36.9 C), temperature source Oral, resp. rate 16, SpO2 96  %. Physical Exam Constitutional:      Appearance: Normal appearance.  HENT:     Head: Normocephalic.     Right Ear: External ear normal.     Left Ear: External ear normal.     Nose: Nose normal.  Eyes:     Extraocular Movements: Extraocular movements intact.     Pupils: Pupils are equal, round, and reactive to light.  Cardiovascular:     Rate and Rhythm: Normal rate.     Pulses: Normal pulses.  Pulmonary:     Effort: Pulmonary effort is normal.     Breath sounds: No wheezing.  Abdominal:     Palpations: Abdomen is soft.  Musculoskeletal:        General: Tenderness and deformity present.     Cervical back: Normal range of motion.  Skin:    General: Skin is warm and dry.     Capillary Refill: Capillary refill takes less than 2 seconds.  Neurological:     General: No focal deficit present.     Mental Status: She is alert.  Psychiatric:        Mood and Affect: Mood normal.        Behavior: Behavior normal.        Thought Content: Thought content normal.     Assessment/Plan: Patient with displaced right femoral neck fracture after fall at exercise class.  Plan right total hip arthroplasty direct anterior approach.  Likely spinal anesthesia discussed.  Exparel plus Marcaine at the time of closure and physical therapy after procedure.  She understands she is likely to be in the hospital for 1 or 2 nights and TCD's mobile and active and  will likely be discharged home with a walker.  Risks of surgery discussed questions elicited and answered she understands request to proceed.  [name] 09/11/2021, 7:34 AM

## 2021-09-11 NOTE — Hospital Course (Addendum)
Kristin Mills is Kristin Mills 66 y.o. female with medical history significant of  breast cancer in remission,  high cholesterol, hypertension, hx of Mitral valve prolapse who presented after Kristin Mills mechanical fall and was found to have Kristin Mills R hip fracture.  S/p surgery per orthopedics.   Plan for discharge home with home health.  See below for additional details

## 2021-09-11 NOTE — TOC CAGE-AID Note (Signed)
Transition of Care [facility]) - CAGE-AID Screening   Patient Details  Name: Renna Kilmer MRN: [id number] Date of Birth: [birth year]  Transition of Care [facility]) CM/SW Contact:    Army Melia, RN Phone [phone] 09/11/2021, 10:07 PM   Clinical Narrative:  Reports hx of social alcohol use, a glass of wine once a month. Reports no interest in cutting down, declined resources.  CAGE-AID Screening:    Have You Ever Felt You Ought to Cut Down on Your Drinking or Drug Use?: No Have People Annoyed You By Critizing Your Drinking Or Drug Use?: No Have You Felt Bad Or Guilty About Your Drinking Or Drug Use?: No Have You Ever Had a Drink or Used Drugs First Thing In The Morning to Steady Your Nerves or to Get Rid of a Hangover?: No CAGE-AID Score: 0  Substance Abuse Education Offered: Yes (declined resources)

## 2021-09-11 NOTE — Op Note (Signed)
Preop diagnosis: Right closed displaced femoral neck fracture  Postop diagnosis: Same  Procedure: Right total hip arthroplasty, direct anterior approach  Surgeon: Rodell Perna MD  Assistant: Benjiman Core, PA-C medically necessary and present for the entire procedure.  Anesthesia: Spinal plus Exparel and Marcaine 10+10 = 20.  EBL approximately 100 cc  Implants:Implants  PIN SECTOR W/GRIP ACE CUP 52MM - [id number]  Inventory Item: PIN SECTOR W/GRIP ACE CUP 52MM Serial no.:  Model/Cat no.: [id number]  Implant name: PIN SECTOR W/GRIP ACE CUP 52MM - [id number] Laterality: Right Area: Hip  Manufacturer: [place] Date of Manufacture:    Action: Implanted Number Used: 1   Device Identifier:  Device Identifier TypeBrock Bad APEX DEPUY - [id number]  Inventory Item: ELIMINATOR HOLE APEX DEPUY Serial no.:  Model/Cat no.: [id number]  Implant nameBrock Bad APEX DEPUY - [id number] Laterality: Right Area: Hip  Manufacturer: [place] Date of Manufacture:    Action: Implanted Number Used: 1   Device Identifier:  Device Identifier Type:     LINER NEUTRAL 52X36MM PLUS 4 - [id number]  Inventory Item: LINER NEUTRAL 52X36MM PLUS 4 Serial no.:  Model/Cat no.: [id number]  Implant name: LINER NEUTRAL 52X36MM PLUS 4 - [id number] Laterality: Right Area: Hip  Manufacturer: [place] Date of Manufacture:    Action: Implanted Number Used: 1   Device Identifier:  Device Identifier Type:     STEM FEM ACTIS STD SZ4 - [id number]  Inventory Item: STEM FEM ACTIS STD SZ4 Serial no.:  Model/Cat no.: [id number]  Implant name: STEM FEM ACTIS STD WY6 - [id number] Laterality: Right Area: Hip  Manufacturer: [place] Date of Manufacture:    Action: Implanted Number Used: 1   Device Identifier:  Device Identifier Type:     ARTICULEZE HEAD - [id number]  Inventory Item: ARTICULEZE HEAD Serial no.:  Model/Cat no.: [id number]  Implant name: ARTICULEZE HEAD -  [id number] Laterality: Right Area: Hip  Manufacturer: [place] Date of Manufacture:    Action: Implanted Number Used: 1   Device Identifier:  Device Identifier Type:    Brief history 66 year old female lost her footing with her shoe catching fell on her side with the rotation and suffered a right femoral neck fracture when she was at a exercise cardio class that she normally participates in on a regular basis.  She had femoral neck fracture displaced.  Procedure: After induction of spinal anesthesia patient had Foley catheter placed placed on the Hana table Unna boots applied C-arm was brought in good visualization of both hips and with slight traction fracture was almost anatomic.  Patient had a high slightly oblique femoral neck fracture.  Hip was prepped with DuraPrep Ancef 2 g was given IV TXA 1 g.  Timeout procedure was completed while the DuraPrep was drying and standard large shower curtain drapes were applied hydraulic hook was sealed at the base.  Sterile skin marker had been used and oblique incision was started 1 fingerbreadth lateral 1 inferior to the ASIS obliquely to the trochanter subtenons tissue was divided with the Bovie fascia was nicked extended in line with skin incision and dull cobra placed over the top the capsule in his retractor lateral capsule was divided transverse bleeders coagulated carefully.  With capsule open femoral neck fracture was immediately identified we had to move distally down on the neck for appropriate level of cut which was completed under C-arm visualization.  Head in the portion of the neck there was further Was removed.  Corkscrew was  used and progressive reaming up to 51 reamer which gave nice fit under C arm for 50 to DePuy press-fit.  No screw was needed and apex hole lemonade was inserted in the center.  Cup was tight no screw was necessary and a +4 neutral liner was placed.  Cup was completely down by x-ray.  Hydraulic Lacinda Axon was applied leg was  excellently rotated 115 taken down and under and the femoral canal was repaired with Mueller retractor medial and large trochanteric clamp carefully placed behind the trochanter.  Cookie cutter rondure lateralization again used the cookie cutter rondure lateralization again used the cookie cutter large curette and then progressively moving up until we progressed to size 4 stem which is extremely tight there is not enough room to place a 5 forgave extremely good fit and fill the canal nicely on radiograph.  After irrigation permanent stem was inserted.  +1.5 slightly short +5 restored identical leg lengths.  External rotation 90 degrees leg good could be taken all the way down to the floor without subluxing or dislocating the hip.  No shock was noted.  Leg lengths were identical on radiograph with a line drawn across the ischial tuberosities.  Permanent ball was inserted with permanent stem identical findings of stability V-Loc closure of the fascia Vicryl subtenons tissue skin staple closure postop dressing and transferred to cover room.

## 2021-09-11 NOTE — Assessment & Plan Note (Signed)
simvastatin 

## 2021-09-11 NOTE — H&P (View-Only) (Signed)
Reason for Consult:fall with right femoral neck fracture Referring Physician: Fayrene Helper, MD  Kristin Mills is an 66 y.o. female.  HPI: 66 year old female was had her exercise class cardio and was an warm up phase going side to side and felt.  She thinks her shoes caught she fell on the right side and had immediate right hip pain and inability to ambulate.  X-rays demonstrated a displaced right femoral neck fracture.  Patient's had past history of breast cancer 10 years ago .  No history of metastatic disease.  Additionally she has some hypertension and elevated cholesterol but is active and healthy.  Past surgical history includes hysterectomy ganglion excision on the right.  Past Medical History:  Diagnosis Date   Breast cancer ([place]) 12/09/11   DCIS right breast, ER+   Glaucoma, anatomical narrow angle    treated with laser surgery at [facility]   Headache(784.0)    sinus   History of radiation therapy 01/12/12 -02/11/12   right breast   Hypercholesterolemia    Lupus anticoagulant positive    Personal history of radiation therapy    Skin cancer    basal cell carcinoma; left side of mouth; removed bu Dr. Allyson Sabal in office    Past Surgical History:  Procedure Laterality Date   ABDOMINAL HYSTERECTOMY  1999   partial; left ovaries   BREAST LUMPECTOMY Right 2013   BREAST LUMPECTOMY WITH NEEDLE LOCALIZATION AND AXILLARY SENTINEL LYMPH NODE BX  12/09/2011   Procedure: BREAST LUMPECTOMY WITH NEEDLE LOCALIZATION AND AXILLARY SENTINEL LYMPH NODE BX;  Surgeon: Shann Medal, MD;  Location: [facility];  Service: General;  Laterality: Right;   BREAST SURGERY Right 2013   DILATION AND CURETTAGE OF UTERUS     EYE SURGERY     laser over 10 years both eyes   GANGLION CYST EXCISION     right   PERIPHERAL IRIDOTOMY  2009   bilateral    Family History  Problem Relation Age of Onset   Skin cancer Mother    Lung cancer Father    Lung cancer Maternal Uncle    Lung cancer Paternal Uncle      Social History:  reports that she has never smoked. She has never used smokeless tobacco. She reports current alcohol use of about 3.0 standard drinks of alcohol per week. She reports that she does not use drugs.  Allergies: No Known Allergies  Medications: I have reviewed the patient's current medications.  Results for orders placed or performed during the hospital encounter of 09/10/21 (from the past 48 hour(s))  Basic metabolic panel     Status: Abnormal   Collection Time: 09/10/21 12:57 PM  Result Value Ref Range   Sodium 138 135 - 145 mmol/L   Potassium 3.8 3.5 - 5.1 mmol/L   Chloride 104 98 - 111 mmol/L   CO2 24 22 - 32 mmol/L   Glucose, Bld 111 (H) 70 - 99 mg/dL    Comment: Glucose reference range applies only to samples taken after fasting for at least 8 hours.   BUN 21 8 - 23 mg/dL   Creatinine, Ser 0.88 0.44 - 1.00 mg/dL   Calcium 9.3 8.9 - 10.3 mg/dL   GFR, Estimated >60 >60 mL/min    Comment: (NOTE) Calculated using the CKD-EPI Creatinine Equation (2021)    Anion gap 10 5 - 15    Comment: Performed at [facility], [place], [place] [address]  CBC WITH DIFFERENTIAL  Status: None   Collection Time: 09/10/21 12:57 PM  Result Value Ref Range   WBC 7.3 4.0 - 10.5 K/uL   RBC 4.58 3.87 - 5.11 MIL/uL   Hemoglobin 13.4 12.0 - 15.0 g/dL   HCT 39.5 36.0 - 46.0 %   MCV 86.2 80.0 - 100.0 fL   MCH 29.3 26.0 - 34.0 pg   MCHC 33.9 30.0 - 36.0 g/dL   RDW 14.5 11.5 - 15.5 %   Platelets 233 150 - 400 K/uL   nRBC 0.0 0.0 - 0.2 %   Neutrophils Relative % 77 %   Neutro Abs 5.5 1.7 - 7.7 K/uL   Lymphocytes Relative 17 %   Lymphs Abs 1.3 0.7 - 4.0 K/uL   Monocytes Relative 5 %   Monocytes Absolute 0.4 0.1 - 1.0 K/uL   Eosinophils Relative 1 %   Eosinophils Absolute 0.1 0.0 - 0.5 K/uL   Basophils Relative 0 %   Basophils Absolute 0.0 0.0 - 0.1 K/uL   Immature Granulocytes 0 %   Abs Immature Granulocytes 0.03 0.00 - 0.07 K/uL     Comment: Performed at [facility], [address], [place], [place] [address]  Protime-INR     Status: None   Collection Time: 09/10/21 12:57 PM  Result Value Ref Range   Prothrombin Time 12.4 11.4 - 15.2 seconds   INR 0.9 0.8 - 1.2    Comment: (NOTE) INR goal varies based on device and disease states. Performed at [facility], [address], [place], [place] [address]   CK     Status: None   Collection Time: 09/10/21  8:03 PM  Result Value Ref Range   Total CK 116 38 - 234 U/L    Comment: Performed at [facility], [place] [address], [place], [place] [address]  Magnesium     Status: None   Collection Time: 09/10/21  8:03 PM  Result Value Ref Range   Magnesium 2.1 1.7 - 2.4 mg/dL    Comment: Performed at [facility], [place] [address], [place], [place] [address]  Phosphorus     Status: None   Collection Time: 09/10/21  8:03 PM  Result Value Ref Range   Phosphorus 3.8 2.5 - 4.6 mg/dL    Comment: Performed at [facility], [place] [address] [place], [place] [address]  HIV Antibody (routine testing w rflx)     Status: None   Collection Time: 09/10/21  8:03 PM  Result Value Ref Range   HIV Screen 4th Generation wRfx Non Reactive Non Reactive    Comment: Performed at [facility], [place] [address] [place], [place] [address]  Type and screen     Status: None   Collection Time: 09/10/21  8:03 PM  Result Value Ref Range   ABO/RH(D) O POS    Antibody Screen NEG    Sample Expiration      09/13/2021,2359 Performed at [facility], [place] [address] [place], [place] [address]   ABO/Rh     Status: None   Collection Time: 09/10/21 11:18 PM  Result Value Ref Range   ABO/RH(D)      O POS Performed at [facility] [address], [place], [place] [address]   Surgical pcr screen     Status: None   Collection Time: 09/11/21  1:29 AM   Specimen: Nasal Mucosa; Nasal Swab  Result Value Ref Range   MRSA, PCR NEGATIVE  NEGATIVE  Staphylococcus aureus NEGATIVE NEGATIVE    Comment: (NOTE) The Xpert SA Assay (FDA approved for NASAL specimens in patients 25 years of age and older), is one component of a comprehensive surveillance program. It is not intended to diagnose infection nor to guide or monitor treatment. Performed at [facility], [place] [address], [place], [place] [address]     DG Chest Port 1 View  Result Date: 09/10/2021 CLINICAL DATA:  pre-op right femoral neck fracture EXAM: PORTABLE CHEST 1 VIEW COMPARISON:  None Available. FINDINGS: The heart size and mediastinal contours are within normal limits. Both lungs are clear. No pleural effusion. The visualized skeletal structures are unremarkable. IMPRESSION: No active disease. Electronically Signed   By: Macy Mis M.D.   On: 09/10/2021 13:30   DG Knee 1-2 Views Right  Result Date: 09/10/2021 CLINICAL DATA:  Fall, pain, initial encounter. EXAM: RIGHT KNEE - 1-2 VIEW COMPARISON:  None Available. FINDINGS: No joint effusion or fracture.  Trace patellofemoral osteophytosis. IMPRESSION: 1. No acute findings. 2. Trace patellofemoral osteophytosis. Electronically Signed   By: Lorin Picket M.D.   On: 09/10/2021 13:26   DG Hip Unilat W or Wo Pelvis 2-3 Views Right  Result Date: 09/10/2021 CLINICAL DATA:  swelling EXAM: DG HIP (WITH OR WITHOUT PELVIS) 2-3V RIGHT COMPARISON:  None Available. FINDINGS: Acute right femoral neck fracture with impaction and proximal displacement. Femoral head appears located. Surrounding soft tissue swelling. IMPRESSION: Acute right femoral neck fracture with impaction and proximal displacement. Electronically Signed   By: Margaretha Sheffield M.D.   On: 09/10/2021 12:30    ROS no history of anesthetic or bleeding problems all other systems 14 point update noncontributory other than as mentioned in HPI. Blood pressure 132/69, pulse 79, temperature 98.4 F (36.9 C), temperature source Oral, resp. rate 16, SpO2 96  %. Physical Exam Constitutional:      Appearance: Normal appearance.  HENT:     Head: Normocephalic.     Right Ear: External ear normal.     Left Ear: External ear normal.     Nose: Nose normal.  Eyes:     Extraocular Movements: Extraocular movements intact.     Pupils: Pupils are equal, round, and reactive to light.  Cardiovascular:     Rate and Rhythm: Normal rate.     Pulses: Normal pulses.  Pulmonary:     Effort: Pulmonary effort is normal.     Breath sounds: No wheezing.  Abdominal:     Palpations: Abdomen is soft.  Musculoskeletal:        General: Tenderness and deformity present.     Cervical back: Normal range of motion.  Skin:    General: Skin is warm and dry.     Capillary Refill: Capillary refill takes less than 2 seconds.  Neurological:     General: No focal deficit present.     Mental Status: She is alert.  Psychiatric:        Mood and Affect: Mood normal.        Behavior: Behavior normal.        Thought Content: Thought content normal.     Assessment/Plan: Patient with displaced right femoral neck fracture after fall at exercise class.  Plan right total hip arthroplasty direct anterior approach.  Likely spinal anesthesia discussed.  Exparel plus Marcaine at the time of closure and physical therapy after procedure.  She understands she is likely to be in the hospital for 1 or 2 nights and TCD's mobile and active and  will likely be discharged home with a walker.  Risks of surgery discussed questions elicited and answered she understands request to proceed.  Kristin Mills 09/11/2021, 7:34 AM

## 2021-09-12 ENCOUNTER — Encounter (HOSPITAL_COMMUNITY): Payer: Self-pay | Admitting: Orthopaedic Surgery

## 2021-09-12 DIAGNOSIS — S72001A Fracture of unspecified part of neck of right femur, initial encounter for closed fracture: Secondary | ICD-10-CM | POA: Diagnosis not present

## 2021-09-12 DIAGNOSIS — D62 Acute posthemorrhagic anemia: Secondary | ICD-10-CM

## 2021-09-12 LAB — BASIC METABOLIC PANEL
Anion gap: 8 (ref 5–15)
BUN: 9 mg/dL (ref 8–23)
CO2: 22 mmol/L (ref 22–32)
Calcium: 8.2 mg/dL — ABNORMAL LOW (ref 8.9–10.3)
Chloride: 107 mmol/L (ref 98–111)
Creatinine, Ser: 0.63 mg/dL (ref 0.44–1.00)
GFR, Estimated: >60 mL/min (ref >60)
Glucose, Bld: 127 mg/dL — ABNORMAL HIGH (ref 70–99)
Potassium: 3.8 mmol/L (ref 3.5–5.1)
Sodium: 137 mmol/L (ref 135–145)

## 2021-09-12 LAB — CBC
HCT: 33.3 % — ABNORMAL LOW (ref 36.0–46.0)
Hemoglobin: 11.3 g/dL — ABNORMAL LOW (ref 12.0–15.0)
MCH: 29.4 pg (ref 26.0–34.0)
MCHC: 33.9 g/dL (ref 30.0–36.0)
MCV: 86.5 fL (ref 80.0–100.0)
Platelets: 165 10*3/uL (ref 150–400)
RBC: 3.85 MIL/uL — ABNORMAL LOW (ref 3.87–5.11)
RDW: 14.4 % (ref 11.5–15.5)
WBC: 6.5 10*3/uL (ref 4.0–10.5)
nRBC: 0 % (ref 0.0–0.2)

## 2021-09-12 LAB — PHOSPHORUS: Phosphorus: 2.7 mg/dL (ref 2.5–4.6)

## 2021-09-12 LAB — MAGNESIUM: Magnesium: 1.9 mg/dL (ref 1.7–2.4)

## 2021-09-12 MED ORDER — ACETAMINOPHEN 500 MG PO TABS
1000.0000 mg | ORAL_TABLET | Freq: Three times a day (TID) | ORAL | Status: DC
  Administered 2021-09-12 – 2021-09-13 (×4): 1000 mg via ORAL
  Filled 2021-09-12 (×4): qty 2

## 2021-09-12 MED ORDER — ACETAMINOPHEN 325 MG PO TABS: 650.0000 mg | ORAL_TABLET | Freq: Four times a day (QID) | ORAL | Status: DC | PRN

## 2021-09-12 MED ORDER — APIXABAN 5 MG PO TABS
5.0000 mg | ORAL_TABLET | Freq: Two times a day (BID) | ORAL | Status: DC
  Administered 2021-09-12 – 2021-09-13 (×2): 5 mg via ORAL
  Filled 2021-09-12 (×2): qty 1

## 2021-09-12 MED ORDER — POLYETHYLENE GLYCOL 3350 17 G PO PACK
17.0000 g | PACK | Freq: Every day | ORAL | Status: DC
  Administered 2021-09-12 – 2021-09-13 (×2): 17 g via ORAL
  Filled 2021-09-12 (×2): qty 1

## 2021-09-12 NOTE — Evaluation (Signed)
Physical Therapy Evaluation Patient Details Name: Kristin Mills MRN: [id number] DOB: Dec 07, 1955 Today's Date: 09/12/2021  History of Present Illness  66 y/o female presented to ED on 09/10/21 after fall at the gym. Sustained acute R femoral neck fx with impaction and proximal displacement. S/p R THA, direct anterior approach on 8/23. PMH: breast cancer in remission, HTN, hx of mitral valve prolapse  Clinical Impression  Patient admitted with the above. PTA, patient lives with husband and independent. Patient presents with weakness, impaired balance, decreased activity tolerance, and pain. Patient required modA for bed mobility and minA for sit to stand transfer. Able to ambulate to/from bathroom with min guard and use of RW. Patient with limited weightbearing due to increased pain. Encouraged continued mobility with nursing staff to/from bathroom. Patient will benefit from skilled PT services during acute stay to address listed deficits. Recommend HHPT at discharge to maximize functional independence and safety. Patient reports husband should be able to rearrange travel work schedule to be with her 24 hour/day for initial return home.        Recommendations for follow up therapy are one component of a multi-disciplinary discharge planning process, led by the attending physician.  Recommendations may be updated based on patient status, additional functional criteria and insurance authorization.  Follow Up Recommendations Home health PT      Assistance Recommended at Discharge Frequent or constant Supervision/Assistance  Patient can return home with the following  A little help with walking and/or transfers;A little help with bathing/dressing/bathroom;Assistance with cooking/housework;Help with stairs or ramp for entrance;Assist for transportation    Equipment Recommendations Rolling Olie Dibert (2 wheels);BSC/3in1  Recommendations for Other Services       Functional Status Assessment Patient has had  a recent decline in their functional status and demonstrates the ability to make significant improvements in function in a reasonable and predictable amount of time.     Precautions / Restrictions Precautions Precautions: Fall Restrictions Weight Bearing Restrictions: Yes RLE Weight Bearing: Weight bearing as tolerated      Mobility  Bed Mobility Overal bed mobility: Needs Assistance Bed Mobility: Supine to Sit     Supine to sit: Mod assist     General bed mobility comments: assist for R LE management and trunk elevation. Increased time to complete scooting towards EOB    Transfers Overall transfer level: Needs assistance Equipment used: Rolling Kaityln Kallstrom (2 wheels) Transfers: Sit to/from Stand Sit to Stand: Min assist           General transfer comment: assist to power up into standing. Cues for hand placement    Ambulation/Gait Ambulation/Gait assistance: Min guard Gait Distance (Feet): 10 Feet (+10) Assistive device: Rolling Renesmae Donahey (2 wheels) Gait Pattern/deviations: Step-to pattern, Decreased stride length, Decreased stance time - right, Knee flexed in stance - right, Decreased weight shift to right, Trunk flexed, Antalgic Gait velocity: decreased     General Gait Details: min guard for safety. Cues for sequencing. Patient with minimal weightbearing through R LE and difficulty advancing R LE.  Stairs            Wheelchair Mobility    Modified Rankin (Stroke Patients Only)       Balance Overall balance assessment: Needs assistance Sitting-balance support: No upper extremity supported, Feet supported Sitting balance-Leahy Scale: Good     Standing balance support: Bilateral upper extremity supported, Reliant on assistive device for balance Standing balance-Leahy Scale: Poor Standing balance comment: reliant on RW for support  Pertinent Vitals/Pain Pain Assessment Pain Assessment: Faces Faces Pain Scale:  Hurts even more Pain Location: R hip Pain Descriptors / Indicators: Constant, Aching, Discomfort Pain Intervention(s): Monitored during session, Repositioned    Home Living Family/patient expects to be discharged to:: Private residence Living Arrangements: Spouse/significant other Available Help at Discharge: Family;Available 24 hours/day (per patient, husband should be able to rearrange travel work schedule to be available for first 2 weeks.) Type of Home: House (townhome) Home Access: Stairs to enter Entrance Stairs-Rails: None Technical brewer of Steps: 2   Home Layout: One level Home Equipment: None      Prior Function Prior Level of Function : Independent/Modified Independent;Driving                     Hand Dominance        Extremity/Trunk Assessment   Upper Extremity Assessment Upper Extremity Assessment: Overall WFL for tasks assessed    Lower Extremity Assessment Lower Extremity Assessment: Generalized weakness;RLE deficits/detail RLE Deficits / Details: s/p R THA - unable to perform SLR but was able to perform slight hip flexion in supine    Cervical / Trunk Assessment Cervical / Trunk Assessment: Normal  Communication   Communication: No difficulties  Cognition Arousal/Alertness: Awake/alert Behavior During Therapy: WFL for tasks assessed/performed Overall Cognitive Status: Within Functional Limits for tasks assessed                                          General Comments      Exercises     Assessment/Plan    PT Assessment Patient needs continued PT services  PT Problem List Decreased strength;Decreased activity tolerance;Decreased balance;Decreased mobility;Decreased safety awareness;Decreased knowledge of use of DME;Pain       PT Treatment Interventions DME instruction;Stair training;Gait training;Functional mobility training;Therapeutic exercise;Therapeutic activities;Balance training;Patient/family education     PT Goals (Current goals can be found in the Care Plan section)  Acute Rehab PT Goals Patient Stated Goal: to reduce pain PT Goal Formulation: With patient Time For Goal Achievement: 09/26/21 Potential to Achieve Goals: Good    Frequency Min 5X/week     Co-evaluation               AM-PAC PT "6 Clicks" Mobility  Outcome Measure Help needed turning from your back to your side while in a flat bed without using bedrails?: A Lot Help needed moving from lying on your back to sitting on the side of a flat bed without using bedrails?: A Lot Help needed moving to and from a bed to a chair (including a wheelchair)?: A Little Help needed standing up from a chair using your arms (e.g., wheelchair or bedside chair)?: A Little Help needed to walk in hospital room?: A Little Help needed climbing 3-5 steps with a railing? : A Lot 6 Click Score: 15    End of Session Equipment Utilized During Treatment: Gait belt Activity Tolerance: Patient tolerated treatment well Patient left: in chair;with call bell/phone within reach;with nursing/sitter in room Nurse Communication: Mobility status PT Visit Diagnosis: Unsteadiness on feet (R26.81);Muscle weakness (generalized) (M62.81);History of falling (Z91.81);Difficulty in walking, not elsewhere classified (R26.2);Pain Pain - Right/Left: Right Pain - part of body: Hip    Time: 1043-1106 PT Time Calculation (min) (ACUTE ONLY): 23 min   Charges:   PT Evaluation $PT Eval Low Complexity: 1 Low PT Treatments $Gait Training: 8-22 mins  Desiraye Rolfson A. Gilford Rile PT, DPT Acute Rehabilitation Services Office [phone]   Linna Hoff 09/12/2021, 1:20 PM

## 2021-09-12 NOTE — Discharge Instructions (Addendum)
INSTRUCTIONS AFTER JOINT REPLACEMENT   Remove items at home which could result in a fall. This includes throw rugs or furniture in walking pathways ICE to the affected joint every three hours while awake for 30 minutes at a time, for at least the first 3-5 days, and then as needed for pain and swelling.  Continue to use ice for pain and swelling. You may notice swelling that will progress down to the foot and ankle.  This is normal after surgery.  Elevate your leg when you are not up walking on it.   Continue to use the breathing machine you got in the hospital (incentive spirometer) which will help keep your temperature down.  It is common for your temperature to cycle up and down following surgery, especially at night when you are not up moving around and exerting yourself.  The breathing machine keeps your lungs expanded and your temperature down.   DIET:  As you were doing prior to hospitalization, we recommend a well-balanced diet.  DRESSING / WOUND CARE / SHOWERING  Keep the surgical dressing until follow up.  The dressing is water proof, so you can shower without any extra covering.  IF THE DRESSING FALLS OFF or the wound gets wet inside, change the dressing with sterile gauze.  Please use good hand washing techniques before changing the dressing.  Do not use any lotions or creams on the incision until instructed by your surgeon.    ACTIVITY  Increase activity slowly as tolerated, but follow the weight bearing instructions below.   No driving for 6 weeks or until further direction given by your physician.  You cannot drive while taking narcotics.  No lifting or carrying greater than 10 lbs. until further directed by your surgeon. Avoid periods of inactivity such as sitting longer than an hour when not asleep. This helps prevent blood clots.  You may return to work once you are authorized by your doctor.     WEIGHT BEARING   Weight bearing as tolerated with assist device (walker, cane,  etc) as directed, use it as long as suggested by your surgeon or therapist, typically at least 4-6 weeks.   EXERCISES  Results after joint replacement surgery are often greatly improved when you follow the exercise, range of motion and muscle strengthening exercises prescribed by your doctor. Safety measures are also important to protect the joint from further injury. Any time any of these exercises cause you to have increased pain or swelling, decrease what you are doing until you are comfortable again and then slowly increase them. If you have problems or questions, call your caregiver or physical therapist for advice.   Rehabilitation is important following a joint replacement. After just a few days of immobilization, the muscles of the leg can become weakened and shrink (atrophy).  These exercises are designed to build up the tone and strength of the thigh and leg muscles and to improve motion. Often times heat used for twenty to thirty minutes before working out will loosen up your tissues and help with improving the range of motion but do not use heat for the first two weeks following surgery (sometimes heat can increase post-operative swelling).     A rehabilitation program following joint replacement surgery can speed recovery and prevent re-injury in the future due to weakened muscles. Contact your doctor or a physical therapist for more information on knee rehabilitation.    CONSTIPATION  Constipation is defined medically as fewer than three stools per week and  severe constipation as less than one stool per week.  Even if you have a regular bowel pattern at home, your normal regimen is likely to be disrupted due to multiple reasons following surgery.  Combination of anesthesia, postoperative narcotics, change in appetite and fluid intake all can affect your bowels.   YOU MUST use at least one of the following options; they are listed in order of increasing strength to get the job done.   They are all available over the counter, and you may need to use some, POSSIBLY even all of these options:    Drink plenty of fluids (prune juice may be helpful) and high fiber foods Colace 100 mg by mouth twice a day  Senokot for constipation as directed and as needed Dulcolax (bisacodyl), take with full glass of water  Miralax (polyethylene glycol) once or twice a day as needed.  If you have tried all these things and are unable to have a bowel movement in the first 3-4 days after surgery call either your surgeon or your primary doctor.    If you experience loose stools or diarrhea, hold the medications until you stool forms back up.  If your symptoms do not get better within 1 week or if they get worse, check with your doctor.  If you experience "the worst abdominal pain ever" or develop nausea or vomiting, please contact the office immediately for further recommendations for treatment.   ITCHING:  If you experience itching with your medications, try taking only a single pain pill, or even half a pain pill at a time.  You can also use Benadryl over the counter for itching or also to help with sleep.   TED HOSE STOCKINGS:  Use stockings on both legs until for at least 2 weeks or as directed by physician office. They may be removed at night for sleeping.  MEDICATIONS:  See your medication summary on the "After Visit Summary" that nursing will review with you.  You may have some home medications which will be placed on hold until you complete the course of blood thinner medication.  It is important for you to complete the blood thinner medication as prescribed.  PRECAUTIONS:  If you experience chest pain or shortness of breath - call 911 immediately for transfer to the hospital emergency department.   If you develop a fever greater that 101 F, purulent drainage from wound, increased redness or drainage from wound, foul odor from the wound/dressing, or calf pain - CONTACT YOUR SURGEON.                                                    FOLLOW-UP APPOINTMENTS:  If you do not already have a post-op appointment, please call the office for an appointment to be seen by your surgeon.  Guidelines for how soon to be seen are listed in your "After Visit Summary", but are typically between 1-4 weeks after surgery.   POST-OPERATIVE OPIOID TAPER INSTRUCTIONS: It is important to wean off of your opioid medication as soon as possible. If you do not need pain medication after your surgery it is ok to stop day one. Opioids include: Codeine, Hydrocodone(Norco, Vicodin), Oxycodone(Percocet, oxycontin) and hydromorphone amongst others.  Long term and even short term use of opiods can cause: Increased pain response Dependence Constipation Depression Respiratory depression And more.  Withdrawal  symptoms can include Flu like symptoms Nausea, vomiting And more Techniques to manage these symptoms Hydrate well Eat regular healthy meals Stay active Use relaxation techniques(deep breathing, meditating, yoga) Do Not substitute Alcohol to help with tapering If you have been on opioids for less than two weeks and do not have pain than it is ok to stop all together.  Plan to wean off of opioids This plan should start within one week post op of your joint replacement. Maintain the same interval or time between taking each dose and first decrease the dose.  Cut the total daily intake of opioids by one tablet each day Next start to increase the time between doses. The last dose that should be eliminated is the evening dose.   MAKE SURE YOU:  Understand these instructions.  Get help right away if you are not doing well or get worse.    Thank you for letting us be a part of your medical care team.  It is a privilege we respect greatly.  We hope these instructions will help you stay on track for a fast and full recovery!     Dental Antibiotics:  In most cases prophylactic antibiotics for Dental  procdeures after total joint surgery are not necessary.  Exceptions are as follows:  1. History of prior total joint infection  2. Severely immunocompromised (Organ Transplant, cancer chemotherapy, Rheumatoid biologic meds such as Edmonson)  3. Poorly controlled diabetes (A1C &gt; 8.0, blood glucose over 200)  If you have one of these conditions, contact your surgeon for an antibiotic prescription, prior to your dental procedure.         Information on my medicine - ELIQUIS (apixaban)  Why was Eliquis prescribed for you? Eliquis was prescribed for you to reduce the risk of blood clots forming after orthopedic surgery.    What do You need to know about Eliquis? Take your Eliquis TWICE DAILY - one tablet in the morning and one tablet in the evening with or without food.  It would be best to take the dose about the same time each day.  If you have difficulty swallowing the tablet whole please discuss with your pharmacist how to take the medication safely.  Take Eliquis exactly as prescribed by your doctor and DO NOT stop taking Eliquis without talking to the doctor who prescribed the medication.  Stopping without other medication to take the place of Eliquis may increase your risk of developing a clot.  After discharge, you should have regular check-up appointments with your healthcare provider that is prescribing your Eliquis.  What do you do if you miss a dose? If a dose of ELIQUIS is not taken at the scheduled time, take it as soon as possible on the same day and twice-daily administration should be resumed.  The dose should not be doubled to make up for a missed dose.  Do not take more than one tablet of ELIQUIS at the same time.  Important Safety Information A possible side effect of Eliquis is bleeding. You should call your healthcare provider right away if you experience any of the following: Bleeding from an injury or your nose that does not stop. Unusual colored  urine (red or dark brown) or unusual colored stools (red or black). Unusual bruising for unknown reasons. A serious fall or if you hit your head (even if there is no bleeding).  Some medicines may interact with Eliquis and might increase your risk of bleeding or clotting while on Eliquis. To help  avoid this, consult your healthcare provider or pharmacist prior to using any new prescription or non-prescription medications, including herbals, vitamins, non-steroidal anti-inflammatory drugs (NSAIDs) and supplements.  This website has more information on Eliquis (apixaban): http://www.eliquis.com/eliquis/home

## 2021-09-12 NOTE — Progress Notes (Addendum)
Patient ID: Kristin Mills, female   DOB: 1955-08-30, 66 y.o.   MRN: [id number]   Subjective: 1 Day Post-Op Procedure(s) (LRB): RIGHT TOTAL HIP ARTHROPLASTY ANTERIOR APPROACH (Right) Patient reports pain as moderate.    Objective: Vital signs in last 24 hours: Temp:  [98.1 F (36.7 C)-99.3 F (37.4 C)] 99.3 F (37.4 C) (08/23 2046) Pulse Rate:  [59-88] 88 (08/24 0505) Resp:  [12-26] 16 (08/24 0505) BP: (82-131)/(44-80) 110/63 (08/24 0505) SpO2:  [95 %-98 %] 97 % (08/24 0505) Weight:  [78.9 kg] 78.9 kg (08/23 1410)  Intake/Output from previous day: 08/23 0701 - 08/24 0700 In: 1883.1 [I.V.:1883.1] Out: 2625 [Urine:2525; Blood:100] Intake/Output this shift: No intake/output data recorded.  Recent Labs    09/10/21 1257 09/11/21 1032 09/12/21 0203  HGB 13.4 12.9 11.3*   Recent Labs    09/11/21 1032 09/12/21 0203  WBC 6.7 6.5  RBC 4.43 3.85*  HCT 39.1 33.3*  PLT 226 165   Recent Labs    09/11/21 1032 09/12/21 0203  NA 138 137  K 4.2 3.8  CL 108 107  CO2 24 22  BUN 12 9  CREATININE 0.78 0.63  GLUCOSE 107* 127*  CALCIUM 9.0 8.2*   Recent Labs    09/10/21 1257  INR 0.9    Neurologically intact DG HIP UNILAT WITH PELVIS 2-3 VIEWS RIGHT  Result Date: 09/11/2021 CLINICAL DATA:  Fracture right hip EXAM: DG HIP (WITH OR WITHOUT PELVIS) 2-3V RIGHT COMPARISON:  Striated on on 09/10/2021 FINDINGS: Fluoroscopic images show internal fixation of fracture of neck of right femur. Fluoroscopy time 13 seconds. Radiation dose 1.39 mGy. IMPRESSION: Fluoroscopic assistance was provided for internal fixation of fracture of neck of right femur. Electronically Signed   By: [name] M.D.   On: 09/11/2021 17:21   DG Hip Port Unilat With Pelvis 1V Right  Result Date: 09/11/2021 CLINICAL DATA:  [id number]; postop check of the right hip EXAM: DG HIP (WITH OR WITHOUT PELVIS) 1V PORT RIGHT COMPARISON:  September 10, 2021 FINDINGS: There has been interval right hip prosthesis. The  prosthetic components are in near anatomical alignment. Soft tissue emphysema. Skin staples. Left hip joint and the remainder of the bony pelvis have a normal appearance. IMPRESSION: Status post right hip prosthesis and the prosthetic components are in near anatomical alignment. Soft tissue emphysema. Electronically Signed   By: [name] M.D.   On: 09/11/2021 17:04    Assessment/Plan: 1 Day Post-Op Procedure(s) (LRB): RIGHT TOTAL HIP ARTHROPLASTY ANTERIOR APPROACH (Right) Up with therapy. Home with husband once mobile and safe with ambulation.my PA Benjiman Core PA-C will see in AM  Kristin Mills 09/12/2021, 7:38 AM

## 2021-09-12 NOTE — Progress Notes (Signed)
PROGRESS NOTE    Kristin Mills  QMG:[id number] DOB: [birth year] DOA: 09/10/2021 PCP: Kristin Mills  Chief Complaint  Patient presents with   Fall   Hip Pain    Brief Narrative:  Kristin Mills is Kristin Mills 66 y.o. female with medical history significant of  breast cancer in remission,  high cholesterol, hypertension, hx of Mitral valve prolapse who presented after Kristin Mills mechanical fall and was found to have Kristin Mills R hip fracture.  Plan for surgery today per ortho.  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[place])   Acute postoperative anemia due to expected blood loss   Assessment and Plan: * Closed displaced fracture of right femoral neck (HCC) Plain films with acute right femoral neck fracture with impaction and proximal displacement Mechanical fall Likely >4 mets Plan for surgery today per orthopedics Post op PT/OT Pain meds, bowel regimen per ortho She'll need anticoagulation with eliquis given hx lupus anticoagulant, see below - 6-8 weeks    Lupus anticoagulant disorder ([place]) Discussed with hematology who recommend 6-8 weeks full dose anticoagulation with eliquis post op  Hyperlipidemia simvastatin  Hypertension amlodipine  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[place]) In remision  Acute postoperative anemia due to expected blood loss trend          DVT prophylaxis: SCD, post op eliquis Code Status: full Family Communication: husband at bedside Disposition:   Status is: Inpatient Remains inpatient appropriate because: pending orthopedic surgery, therapy   Consultants:  ortho  Procedures:  pending  Antimicrobials:  Anti-infectives (From admission, onward)    Start     Dose/Rate Route  Frequency Ordered Stop   09/11/21 1445  ceFAZolin (ANCEF) IVPB 2g/100 mL premix        2 g 200 mL/hr over 30 Minutes Intravenous To [facility] 09/11/21 0746 09/11/21 1515       Subjective: Feels ok Pain about Kristin Mills 5   Objective: Vitals:   09/11/21 1815 09/11/21 2046 09/12/21 0505 09/12/21 1100  BP: 100/72 112/63 110/63 106/62  Pulse: 77 83 88 80  Resp: '17 16 16 17  '$ Temp:  99.3 F (37.4 C)  98 F (36.7 C)  TempSrc:    Oral  SpO2: 96% 98% 97% 94%  Weight:      Height:        Intake/Output Summary (Last 24 hours) at 09/12/2021 1432 Last data filed at 09/12/2021 0600 Gross per 24 hour  Intake 1756.36 ml  Output 2625 ml  Net -868.64 ml   Filed Weights   09/11/21 1410  Weight: 78.9 kg    Examination:  General: No acute distress. Cardiovascular: RRR Lungs: unlabored Abdomen: Soft, nontender, nondistended  Neurological: Alert and oriented 3. Moves all extremities 4 . Cranial nerves II through XII grossly intact. Skin: Warm and dry. No rashes or lesions. Extremities: dressing to R thigh     Data Reviewed: I have personally reviewed following labs and imaging studies  CBC: Recent Labs  Lab 09/10/21 1257 09/11/21 1032 09/12/21 0203  WBC 7.3 6.7 6.5  NEUTROABS 5.5 5.3  --   HGB 13.4 12.9 11.3*  HCT 39.5 39.1 33.3*  MCV 86.2 88.3 86.5  PLT 233 226 326    Basic Metabolic Panel:  Recent Labs  Lab 09/10/21 1257 09/10/21 2003 09/11/21 1032 09/12/21 0203  NA 138  --  138 137  K 3.8  --  4.2 3.8  CL 104  --  108 107  CO2 24  --  24 22  GLUCOSE 111*  --  107* 127*  BUN 21  --  12 9  CREATININE 0.88  --  0.78 0.63  CALCIUM 9.3  --  9.0 8.2*  MG  --  2.1  --  1.9  PHOS  --  3.8  --  2.7    GFR: Estimated Creatinine Clearance: 74.3 mL/min (by C-G formula based on SCr of 0.63 mg/dL).  Liver Function Tests: Recent Labs  Lab 09/11/21 1032  AST 19  ALT 15  ALKPHOS 54  BILITOT 1.8*  PROT 6.6  ALBUMIN 4.0    CBG: No results for input(s):  "GLUCAP" in the last 168 hours.   Recent Results (from the past 240 hour(s))  Surgical pcr screen     Status: None   Collection Time: 09/11/21  1:29 AM   Specimen: Nasal Mucosa; Nasal Swab  Result Value Ref Range Status   MRSA, PCR NEGATIVE NEGATIVE Final   Staphylococcus aureus NEGATIVE NEGATIVE Final    Comment: (NOTE) The Xpert SA Assay (FDA approved for NASAL specimens in patients 1 years of age and older), is one component of Kristin Mills comprehensive surveillance program. It is not intended to diagnose infection nor to guide or monitor treatment. Performed at [facility], [place] [address], [place], [place] [address]          Radiology Studies: DG HIP UNILAT WITH PELVIS 2-3 VIEWS RIGHT  Result Date: 09/11/2021 CLINICAL DATA:  Fracture right hip EXAM: DG HIP (WITH OR WITHOUT PELVIS) 2-3V RIGHT COMPARISON:  Striated on on 09/10/2021 FINDINGS: Fluoroscopic images show internal fixation of fracture of neck of right femur. Fluoroscopy time 13 seconds. Radiation dose 1.39 mGy. IMPRESSION: Fluoroscopic assistance was provided for internal fixation of fracture of neck of right femur. Electronically Signed   By: Kristin Picker M.D.   On: 09/11/2021 17:21   DG Hip Port Unilat With Pelvis 1V Right  Result Date: 09/11/2021 CLINICAL DATA:  Kristin Mills; postop check of the right hip EXAM: DG HIP (WITH OR WITHOUT PELVIS) 1V PORT RIGHT COMPARISON:  September 10, 2021 FINDINGS: There has been interval right hip prosthesis. The prosthetic components are in near anatomical alignment. Soft tissue emphysema. Skin staples. Left hip joint and the remainder of the bony pelvis have Kristin Mills normal appearance. IMPRESSION: Status post right hip prosthesis and the prosthetic components are in near anatomical alignment. Soft tissue emphysema. Electronically Signed   By: Kristin Richards M.D.   On: 09/11/2021 17:04        Scheduled Meds:  acetaminophen  1,000 mg Oral Q8H   amLODipine  5 mg Oral Daily   apixaban  5 mg  Oral BID   docusate sodium  100 mg Oral BID   polyethylene glycol  17 g Oral Daily   simvastatin  10 mg Oral Daily   Continuous Infusions:  sodium chloride 50 mL/hr at 09/11/21 0827   sodium chloride 85 mL/hr at 09/11/21 2016   methocarbamol (ROBAXIN) IV       LOS: 2 days    Time spent: over 30 min    Fayrene Helper, MD [facility]   To contact the attending provider between 7A-7P or the covering provider during after hours 7P-7A, please log into the web site  [email or web address] and access using universal [place] password for that web site. If you Mills not have the password, please call the hospital operator.  09/12/2021, 2:32 PM

## 2021-09-12 NOTE — Anesthesia Postprocedure Evaluation (Signed)
Anesthesia Post Note  Patient: Kristin Mills  Procedure(s) Performed: RIGHT TOTAL HIP ARTHROPLASTY ANTERIOR APPROACH (Right: Hip)     Patient location during evaluation: PACU Anesthesia Type: Spinal Level of consciousness: oriented and awake and alert Pain management: pain level controlled Vital Signs Assessment: post-procedure vital signs reviewed and stable Respiratory status: spontaneous breathing, respiratory function stable and patient connected to nasal cannula oxygen Cardiovascular status: blood pressure returned to baseline and stable Postop Assessment: no headache, no backache and no apparent nausea or vomiting Anesthetic complications: no   No notable events documented.          Effie Berkshire

## 2021-09-12 NOTE — Assessment & Plan Note (Signed)
trend

## 2021-09-13 ENCOUNTER — Telehealth: Payer: Self-pay | Admitting: Radiology

## 2021-09-13 DIAGNOSIS — R739 Hyperglycemia, unspecified: Secondary | ICD-10-CM

## 2021-09-13 LAB — PHOSPHORUS: Phosphorus: 3 mg/dL (ref 2.5–4.6)

## 2021-09-13 LAB — CBC WITH DIFFERENTIAL/PLATELET
Abs Immature Granulocytes: 0.02 10*3/uL (ref 0.00–0.07)
Basophils Absolute: 0 10*3/uL (ref 0.0–0.1)
Basophils Relative: 0 %
Eosinophils Absolute: 0.2 10*3/uL (ref 0.0–0.5)
Eosinophils Relative: 3 %
HCT: 33.3 % — ABNORMAL LOW (ref 36.0–46.0)
Hemoglobin: 11.2 g/dL — ABNORMAL LOW (ref 12.0–15.0)
Immature Granulocytes: 0 %
Lymphocytes Relative: 20 %
Lymphs Abs: 1.5 10*3/uL (ref 0.7–4.0)
MCH: 29.7 pg (ref 26.0–34.0)
MCHC: 33.6 g/dL (ref 30.0–36.0)
MCV: 88.3 fL (ref 80.0–100.0)
Monocytes Absolute: 0.7 10*3/uL (ref 0.1–1.0)
Monocytes Relative: 10 %
Neutro Abs: 4.7 10*3/uL (ref 1.7–7.7)
Neutrophils Relative %: 67 %
Platelets: 181 10*3/uL (ref 150–400)
RBC: 3.77 MIL/uL — ABNORMAL LOW (ref 3.87–5.11)
RDW: 14.1 % (ref 11.5–15.5)
WBC: 7.1 10*3/uL (ref 4.0–10.5)
nRBC: 0 % (ref 0.0–0.2)

## 2021-09-13 LAB — BASIC METABOLIC PANEL
Anion gap: 10 (ref 5–15)
BUN: 9 mg/dL (ref 8–23)
CO2: 24 mmol/L (ref 22–32)
Calcium: 8.8 mg/dL — ABNORMAL LOW (ref 8.9–10.3)
Chloride: 105 mmol/L (ref 98–111)
Creatinine, Ser: 0.89 mg/dL (ref 0.44–1.00)
GFR, Estimated: >60 mL/min (ref >60)
Glucose, Bld: 109 mg/dL — ABNORMAL HIGH (ref 70–99)
Potassium: 3.5 mmol/L (ref 3.5–5.1)
Sodium: 139 mmol/L (ref 135–145)

## 2021-09-13 LAB — MAGNESIUM: Magnesium: 2.2 mg/dL (ref 1.7–2.4)

## 2021-09-13 MED ORDER — OXYCODONE-ACETAMINOPHEN 5-325 MG PO TABS: 1.0000 | ORAL_TABLET | Freq: Four times a day (QID) | ORAL | 0 refills | Status: DC | PRN

## 2021-09-13 MED ORDER — METHOCARBAMOL 500 MG PO TABS: 500.0000 mg | ORAL_TABLET | Freq: Four times a day (QID) | ORAL | 0 refills | Status: AC | PRN

## 2021-09-13 MED ORDER — VITAMIN D 600 IU CAPSULE SWOG S0812
600.0000 [IU] | ORAL_CAPSULE | Freq: Every day | ORAL | 0 refills | Status: DC
Start: 2021-09-13 — End: 2021-09-13

## 2021-09-13 MED ORDER — VITAMIN D 25 MCG (1000 UNIT) PO TABS: 1000.0000 [IU] | ORAL_TABLET | Freq: Every day | ORAL | 0 refills | Status: AC

## 2021-09-13 MED ORDER — APIXABAN 5 MG PO TABS: 5.0000 mg | ORAL_TABLET | Freq: Two times a day (BID) | ORAL | 0 refills | Status: DC

## 2021-09-13 MED ORDER — FERROUS SULFATE 325 (65 FE) MG PO TABS
325.0000 mg | ORAL_TABLET | ORAL | Status: DC
  Administered 2021-09-13: 325 mg via ORAL
  Filled 2021-09-13: qty 1

## 2021-09-13 NOTE — Progress Notes (Addendum)
Subjective: Patient complains of right anterior hip pain.  Has ambulated in the hall and appears to be making good progress with PT.  She is due to do the stairs this afternoon.   Objective: Vital signs in last 24 hours: Temp:  [97.6 F (36.4 C)-98.4 F (36.9 C)] 97.6 F (36.4 C) (08/25 0756) Pulse Rate:  [77-93] 93 (08/25 0756) Resp:  [16-18] 17 (08/25 0756) BP: (90-115)/(55-62) 99/62 (08/25 0756) SpO2:  [97 %] 97 % (08/25 0756)  Intake/Output from previous day: 08/24 0701 - 08/25 0700 In: 852.6 [P.O.:350; I.V.:502.6] Out: -  Intake/Output this shift: Total I/O In: 240 [P.O.:240] Out: -   Recent Labs    09/11/21 1032 09/12/21 0203 09/13/21 0215  HGB 12.9 11.3* 11.2*   Recent Labs    09/12/21 0203 09/13/21 0215  WBC 6.5 7.1  RBC 3.85* 3.77*  HCT 33.3* 33.3*  PLT 165 181   Recent Labs    09/12/21 0203 09/13/21 0215  NA 137 139  K 3.8 3.5  CL 107 105  CO2 22 24  BUN 9 9  CREATININE 0.63 0.89  GLUCOSE 127* 109*  CALCIUM 8.2* 8.8*   No results for input(s): "LABPT", "INR" in the last 72 hours.  Exam Very pleasant white female alert and oriented in no acute distress.  Right hip wound looks good.  Staples intact.  No drainage or signs of infection.  Bilateral calves Nontender.    Assessment/Plan: We will see how patient does with PT this afternoon.  She is stable from orthopedic standpoint.  Discharge once cleared by medicine team.  Prescriptions sent in for Percocet, Eliquis due to history of lupus anticoagulant and Robaxin for spasms.  Needs return office visit with Dr. Lorin Mercy 1 week postop.     Benjiman Core 09/13/2021, 1:03 PM

## 2021-09-13 NOTE — Progress Notes (Signed)
Physical Therapy Treatment Patient Details Name: Kristin Mills MRN: [id number] DOB: [birth year] Today's Date: 09/13/2021   History of Present Illness 66 y/o female presented to ED on 09/10/21 after fall at the gym. Sustained acute R femoral neck fx with impaction and proximal displacement. S/p R THA, direct anterior approach on 8/23. PMH: breast cancer in remission, HTN, hx of mitral valve prolapse    PT Comments    Patient making excellent progress towards therapy goals. Patient increasing ambulation distance this session with use of RW. Able to negotiate stairs safely with use of cane and HHAx1 to access home environment with no rails. Provided patient with HEP handout and instructed on exercises. D/c plan remains appropriate.    Recommendations for follow up therapy are one component of a multi-disciplinary discharge planning process, led by the attending physician.  Recommendations may be updated based on patient status, additional functional criteria and insurance authorization.  Follow Up Recommendations  Home health PT     Assistance Recommended at Discharge Frequent or constant Supervision/Assistance  Patient can return home with the following A little help with walking and/or transfers;A little help with bathing/dressing/bathroom;Assistance with cooking/housework;Help with stairs or ramp for entrance;Assist for transportation   Equipment Recommendations  Rolling Kristin Mills (2 wheels);BSC/3in1    Recommendations for Other Services       Precautions / Restrictions Precautions Precautions: Fall Restrictions Weight Bearing Restrictions: Yes RLE Weight Bearing: Weight bearing as tolerated     Mobility  Bed Mobility Overal bed mobility: Needs Assistance Bed Mobility: Supine to Sit     Supine to sit: Min guard     General bed mobility comments: min guard for safety. Able to utilize L LE to assist R LE off bed    Transfers Overall transfer level: Needs assistance Equipment  used: Rolling Kristin Mills (2 wheels) Transfers: Sit to/from Stand Sit to Stand: Min guard           General transfer comment: min guard for safety. Good recall of hand placement    Ambulation/Gait Ambulation/Gait assistance: Min guard Gait Distance (Feet): 75 Feet Assistive device: Rolling Kristin Mills (2 wheels) Gait Pattern/deviations: Step-to pattern, Decreased stride length, Decreased stance time - right, Knee flexed in stance - right, Decreased weight shift to right, Trunk flexed, Antalgic Gait velocity: decreased     General Gait Details: min guard for safety. Improved tolerance to WBing this session but still with heavy reliance of UE support.   Stairs Stairs: Yes Stairs assistance: Min guard Stair Management: Step to pattern, Forwards, With cane (+ HHAx1) Number of Stairs: 2 General stair comments: simulated home environment with no rails but utilized SPC and HHAx1. Instructed and demonstrated safe stair negotiation with up with good and down with bad. Able to negotiate 2 stairs with min guard   Wheelchair Mobility    Modified Rankin (Stroke Patients Only)       Balance Overall balance assessment: Needs assistance Sitting-balance support: No upper extremity supported, Feet supported Sitting balance-Leahy Scale: Good     Standing balance support: Bilateral upper extremity supported, Reliant on assistive device for balance Standing balance-Leahy Scale: Poor Standing balance comment: reliant on RW for support                            Cognition Arousal/Alertness: Awake/alert Behavior During Therapy: WFL for tasks assessed/performed Overall Cognitive Status: Within Functional Limits for tasks assessed  Exercises Other Exercises Other Exercises: provided HEP handout    General Comments        Pertinent Vitals/Pain Pain Assessment Pain Assessment: Faces Faces Pain Scale: Hurts little  more Pain Location: R hip Pain Descriptors / Indicators: Constant, Aching, Discomfort Pain Intervention(s): Monitored during session, Repositioned    Home Living                          Prior Function            PT Goals (current goals can now be found in the care plan section) Acute Rehab PT Goals PT Goal Formulation: With patient Time For Goal Achievement: 09/26/21 Potential to Achieve Goals: Good Progress towards PT goals: Progressing toward goals    Frequency    Min 5X/week      PT Plan Current plan remains appropriate    Co-evaluation              AM-PAC PT "6 Clicks" Mobility   Outcome Measure  Help needed turning from your back to your side while in a flat bed without using bedrails?: A Little Help needed moving from lying on your back to sitting on the side of a flat bed without using bedrails?: A Little Help needed moving to and from a bed to a chair (including a wheelchair)?: A Little Help needed standing up from a chair using your arms (e.g., wheelchair or bedside chair)?: A Little Help needed to walk in hospital room?: A Little Help needed climbing 3-5 steps with a railing? : A Little 6 Click Score: 18    End of Session Equipment Utilized During Treatment: Gait belt Activity Tolerance: Patient tolerated treatment well Patient left: in chair;with call bell/phone within reach Nurse Communication: Mobility status PT Visit Diagnosis: Unsteadiness on feet (R26.81);Muscle weakness (generalized) (M62.81);History of falling (Z91.81);Difficulty in walking, not elsewhere classified (R26.2);Pain Pain - Right/Left: Right Pain - part of body: Hip     Time: 2458-0998 PT Time Calculation (min) (ACUTE ONLY): 31 min  Charges:  $Gait Training: 23-37 mins                     Kristin Mills A. Gilford Rile PT, DPT Acute Rehabilitation Services Office [phone]    Kristin Mills 09/13/2021, 1:22 PM

## 2021-09-13 NOTE — TOC Transition Note (Addendum)
Transition of Care [facility]) - CM/SW Discharge Note   Patient Details  Name: Kristin Mills MRN: [id number] Date of Birth: [birth year]  Transition of [facility]) CM/SW Contact:  Sharin Mons, RN Phone Number: 09/13/2021, 5:05 PM   Clinical Narrative:    Patient will DC to: home Anticipated DC date: 09/13/2021 Family notified:yes Transport by: car         -s/p S/p R THA, 8/23  Per MD patient ready for DC today. RN, patient, and patient's husband notified of DC. Family to assist with care once d/c. Orders noted for home health services and DME. Pt agreeable to home health services without a preference  for provider. Referral made with [place] 8/29. Referral made with [facility] for DME: RW, BSC. Equipment noted @ bedside for home. Pt without Rx concernes. Post hospital follow up noted on AVS. Husband to provide transportation to home.  RNCM will sign off for now as intervention is no longer needed. Please consult [place] again if new needs arise.    Final next level of care: [place] Barriers to Discharge: No Barriers Identified   Patient Goals and CMS Choice        Discharge Placement                       Discharge Plan and Services                          HH Arranged: PT HH Agency: [place] Date [place]: 09/13/21 Time [place]: 1703 Representative spoke with at [place]: Tommi Rumps  Social Determinants of Health (Strong) Interventions     Readmission Risk Interventions     No data to display

## 2021-09-13 NOTE — Telephone Encounter (Signed)
Received email from [place] [employer] requesting prior authorization for Oxycodone-Acetaminophen 5/325.  Information submitted, awaiting response.  Tyler Deis Key: [id number] - PA Case ID: [id number] - Rx #: [id number] Need help? Call us at [phone] Status Sent to [facility] oxyCODONE-Acetaminophen 5-'325MG'$  tablets Form [employer] Medicare Part D Electronic Prior Authorization Form [phone] NCPDP) [place] [phone]

## 2021-09-13 NOTE — Care Management Important Message (Signed)
Important Message  Patient Details  Name: Kristin Mills MRN: [id number] Date of Birth: 22-Oct-1955   Medicare Important Message Given:  Yes     Hannah Beat 09/13/2021, 12:11 PM

## 2021-09-13 NOTE — Assessment & Plan Note (Signed)
Mild, check a1c outpatient

## 2021-09-13 NOTE — Telephone Encounter (Signed)
Received response from Cover [employer] Med[employer]. Medication approved.  Tyler Deis Key: [id number] - PA Case ID: [id number] - Rx #: [id number] Need help? Call us at [phone] Outcome Approvedtoday Request Reference Number: [id number]. OXYCOD/APAP TAB 5-'325MG'$  is approved through 10/13/2021. Your patient may now fill this prescription and it will be covered. Drug oxyCODONE-Acetaminophen 5-'325MG'$  tablets Form [employer] Medicare Part D Electronic Prior Authorization Form [phone] NCPDP) [place] [phone]

## 2021-09-13 NOTE — Discharge Summary (Signed)
Physician Discharge Summary  Kristin Mills RFF:[id number] DOB: 12-18-1955 DOA: 09/10/2021  PCP: [name], DO  Admit date: 09/10/2021 Discharge date: 09/13/2021  Time spent: 40 minutes  Recommendations for Outpatient Follow-up:  Follow outpatient CBC/CMP  Follow with orthopedics outpatient Follow osteoporosis treatment outpatient Continue anticoagulation for 6-8 weeks for hx lupus anticoagulate  Needs outpatient a1c  Discharge Diagnoses:  Principal Problem:   Closed displaced fracture of right femoral neck (Kristin Mills) Active Problems:   Lupus anticoagulant disorder ([place])   Hypertension   Hyperlipidemia   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 female with medical history significant of  breast cancer in remission,  high cholesterol, hypertension, hx of Mitral valve prolapse who presented after Kristin Mills mechanical fall and was found to have Kristin Mills R hip fracture.  S/p surgery per orthopedics.   Plan for discharge home with home health.  See below for additional details     Hospital Course:  Assessment and Plan: * Closed displaced fracture of right femoral neck (HCC) Plain films with acute right femoral neck fracture with impaction and proximal displacement Mechanical fall Likely >4 mets S/p right total hip arthroplasty, direct anterior approach 8/23 Post op PT/OT Pain meds, bowel regimen per ortho Needs outpatient follow up with PCP for management of osteoporosis  She'll need anticoagulation with eliquis given hx lupus anticoagulant, see below - 6-8 weeks    Lupus anticoagulant disorder [facility]) Discussed with hematology who recommend 6-8 weeks full dose anticoagulation with eliquis post  op  Hyperlipidemia simvastatin  Hypertension amlodipine  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 Cardiovascular: RRR Lungs: unlabored Neurological: Alert and oriented 3. Moves all extremities 4 with equal strength. Cranial nerves II through XII grossly intact. Extremities: R thigh intact dressing  Discharge Instructions   Discharge Instructions     Call MD for:  difficulty breathing, headache or visual disturbances   Complete by: As directed    Call MD for:  extreme fatigue   Complete by: As directed    Call MD for:  hives   Complete by: As directed    Call MD for:  persistant dizziness or light-headedness   Complete by: As directed    Call MD for:  persistant nausea and vomiting   Complete by: As directed    Call MD for:  redness, tenderness, or signs of infection (pain, swelling, redness, odor or green/yellow discharge around incision site)   Complete by: As directed    Call MD for:  severe uncontrolled pain   Complete by: As directed    Call MD for:  temperature >100.4   Complete by: As directed    Diet - low  sodium heart healthy   Complete by: As directed    Discharge instructions   Complete by: As directed    You were seen for Kristin Mills hip fracture.    This was surgically repaired by orthopedics.  You'll continue to have therapy at home.  Please follow up with orthopedics as an outpatient.  Follow up with your PCP regarding osteoporosis and treatment for this.     Continue tylenol as needed for your pain.  Do not exceed more than 3 grams of tylenol in 1 day (of note, your percocet also has tylenol in it).    Your blood sugars were mildly elevated at times.  I recommend getting an A1c with your PCP outpatient.   Return for new, recurrent, or worsening symptoms.  Please ask your PCP to request records from this hospitalization so they know what was done and what the next steps will be.   Discharge wound care:   Complete by: As directed    Per orthopedics   Increase activity slowly   Complete by: As directed       Allergies as of 09/13/2021   No Known Allergies      Medication List     STOP taking these medications    aspirin EC 81 MG tablet       TAKE these medications    amLODipine 5 MG tablet Commonly known as: NORVASC Take 5 mg by mouth daily.   apixaban 5 MG Tabs tablet Commonly known as: ELIQUIS Take 1 tablet (5 mg total) by mouth 2 (two) times daily. PER DR YATES MUST TAKE AT LEAST 6-8 WEEKS POSTOP   cholecalciferol 25 MCG (1000 UNIT) tablet Commonly known as: VITAMIN D3 Take 1 tablet (1,000 Units total) by mouth daily.   Guaifenesin 1200 MG Tb12 Commonly known as: Mucinex Maximum Strength Take 1 tablet (1,200 mg total) by mouth every 12 (twelve) hours as needed.   methocarbamol 500 MG tablet Commonly known as: ROBAXIN Take 1 tablet (500 mg total) by mouth every 6 (six) hours as needed for muscle spasms.   oxyCODONE-acetaminophen 5-325 MG tablet Commonly known as: PERCOCET/ROXICET Take 1 tablet by mouth every 6 (six) hours as needed for severe pain.   simvastatin 10 MG tablet Commonly known as: ZOCOR Take 10 mg by mouth daily.               Durable Medical Equipment  (From admission, onward)           Start     Ordered   09/13/21 1629  DME 3-in-1  Once        09/13/21 1632   09/13/21 1629  DME Walker  Once       Question Answer Comment  Walker: With [place] Wheels   Patient needs Rhodie Cienfuegos walker  to treat with the following condition Hip fracture ([place])      09/13/21 1632   09/12/21 1616  For home use only DME 3 n 1  Once        09/12/21 1615   09/12/21 1612  For home use only DME Walker rolling  Once       Question Answer Comment  Walker: With [place] Wheels   Patient needs Sophiya Morello walker to treat with the following condition Gait abnormality      09/12/21 1612              Discharge Care Instructions  (From admission, onward)  Start     Ordered   09/13/21 0000  Discharge wound care:       Comments: Per orthopedics   09/13/21 1632           No Known Allergies  Follow-up Information     Kristin Killings, MD Follow up in 1 week(s).   Specialty: Orthopedic Surgery Contact information: [place] [address] [phone]                  The results of significant diagnostics from this hospitalization (including imaging, microbiology, ancillary and laboratory) are listed below for reference.    Significant Diagnostic Studies: DG HIP UNILAT WITH PELVIS 2-3 VIEWS RIGHT  Result Date: 09/11/2021 CLINICAL DATA:  Fracture right hip EXAM: DG HIP (WITH OR WITHOUT PELVIS) 2-3V RIGHT COMPARISON:  Striated on on 09/10/2021 FINDINGS: Fluoroscopic images show internal fixation of fracture of neck of right femur. Fluoroscopy time 13 seconds. Radiation dose 1.39 mGy. IMPRESSION: Fluoroscopic assistance was provided for internal fixation of fracture of neck of right femur. Electronically Signed   By: Kristin Mills M.D.   On: 09/11/2021 17:21   DG Hip Port Unilat With Pelvis 1V Right  Result Date: 09/11/2021 CLINICAL DATA:  193790; postop check of the right hip EXAM: DG HIP (WITH OR WITHOUT PELVIS) 1V PORT RIGHT COMPARISON:  September 10, 2021 FINDINGS: There has been interval right hip prosthesis. The prosthetic components are in near anatomical alignment. Soft tissue emphysema. Skin staples. Left hip joint and the remainder of the bony pelvis  have Guilianna Mckoy normal appearance. IMPRESSION: Status post right hip prosthesis and the prosthetic components are in near anatomical alignment. Soft tissue emphysema. Electronically Signed   By: [name] M.D.   On: 09/11/2021 17:04   DG Chest Port 1 View  Result Date: 09/10/2021 CLINICAL DATA:  pre-op right femoral neck fracture EXAM: PORTABLE CHEST 1 VIEW COMPARISON:  None Available. FINDINGS: The heart size and mediastinal contours are within normal limits. Both lungs are clear. No pleural effusion. The visualized skeletal structures are unremarkable. IMPRESSION: No active disease. Electronically Signed   By: Macy Mis M.D.   On: 09/10/2021 13:30   DG Knee 1-2 Views Right  Result Date: 09/10/2021 CLINICAL DATA:  Fall, pain, initial encounter. EXAM: RIGHT KNEE - 1-2 VIEW COMPARISON:  None Available. FINDINGS: No joint effusion or fracture.  Trace patellofemoral osteophytosis. IMPRESSION: 1. No acute findings. 2. Trace patellofemoral osteophytosis. Electronically Signed   By: Lorin Picket M.D.   On: 09/10/2021 13:26   DG Hip Unilat W or Wo Pelvis 2-3 Views Right  Result Date: 09/10/2021 CLINICAL DATA:  swelling EXAM: DG HIP (WITH OR WITHOUT PELVIS) 2-3V RIGHT COMPARISON:  None Available. FINDINGS: Acute right femoral neck fracture with impaction and proximal displacement. Femoral head appears located. Surrounding soft tissue swelling. IMPRESSION: Acute right femoral neck fracture with impaction and proximal displacement. Electronically Signed   By: Margaretha Sheffield M.D.   On: 09/10/2021 12:30    Microbiology: Recent Results (from the past 240 hour(s))  Surgical pcr screen     Status: None   Collection Time: 09/11/21  1:29 AM   Specimen: Nasal Mucosa; Nasal Swab  Result Value Ref Range Status   MRSA, PCR NEGATIVE NEGATIVE Final   Staphylococcus aureus NEGATIVE NEGATIVE Final    Comment: (NOTE) The Xpert SA Assay (FDA approved for NASAL specimens in patients 92 years of age and older), is  one component of Annaka Cleaver comprehensive surveillance program. It is not  intended to diagnose infection nor to guide or monitor treatment. Performed at [facility], [place] [address], [place], [place] [address]      Labs: Basic Metabolic Panel: Recent Labs  Lab 09/10/21 1257 09/10/21 2003 09/11/21 1032 09/12/21 0203 09/13/21 0215  NA 138  --  138 137 139  K 3.8  --  4.2 3.8 3.5  CL 104  --  108 107 105  CO2 24  --  '24 22 24  '$ GLUCOSE 111*  --  107* 127* 109*  BUN 21  --  '12 9 9  '$ CREATININE 0.88  --  0.78 0.63 0.89  CALCIUM 9.3  --  9.0 8.2* 8.8*  MG  --  2.1  --  1.9 2.2  PHOS  --  3.8  --  2.7 3.0   Liver Function Tests: Recent Labs  Lab 09/11/21 1032  AST 19  ALT 15  ALKPHOS 54  BILITOT 1.8*  PROT 6.6  ALBUMIN 4.0   No results for input(s): "LIPASE", "AMYLASE" in the last 168 hours. No results for input(s): "AMMONIA" in the last 168 hours. CBC: Recent Labs  Lab 09/10/21 1257 09/11/21 1032 09/12/21 0203 09/13/21 0215  WBC 7.3 6.7 6.5 7.1  NEUTROABS 5.5 5.3  --  4.7  HGB 13.4 12.9 11.3* 11.2*  HCT 39.5 39.1 33.3* 33.3*  MCV 86.2 88.3 86.5 88.3  PLT 233 226 165 181   Cardiac Enzymes: Recent Labs  Lab 09/10/21 2003  CKTOTAL 116   BNP: BNP (last 3 results) No results for input(s): "BNP" in the last 8760 hours.  ProBNP (last 3 results) No results for input(s): "PROBNP" in the last 8760 hours.  CBG: No results for input(s): "GLUCAP" in the last 168 hours.     Signed:  Fayrene Helper MD.  [facility] 09/13/2021, 5:07 PM

## 2021-09-13 NOTE — Progress Notes (Deleted)
Physician Discharge Summary  Kristin Mills ZOX:[id number] DOB: 03-Oct-1955 DOA: 09/10/2021  PCP: Kristin Morning, DO  Admit date: 09/10/2021 Discharge date: 09/13/2021  Time spent: 40 minutes  Recommendations for Outpatient Follow-up:  Follow outpatient CBC/CMP  Follow with orthopedics outpatient Follow osteoporosis treatment outpatient Continue anticoagulation for 6-8 weeks for hx lupus anticoagulate  Needs outpatient a1c  Discharge Diagnoses:  Principal Problem:   Closed displaced fracture of right femoral neck (Kristin Mills) Active Problems:   Lupus anticoagulant disorder (Kristin Mills)   Hypertension   Hyperlipidemia   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Kristin Mills)   Acute postoperative anemia due to expected blood loss   Hyperglycemia   Discharge Condition: stable  Diet recommendation: heart healthy  Filed Weights   09/11/21 1410  Weight: 78.9 kg    History of present illness:  Kristin Mills is Kristin Mills 66 y.o. female with medical history significant of  breast cancer in remission,  high cholesterol, hypertension, hx of Mitral valve prolapse who presented after Kristin Mills mechanical fall and was found to have Kristin Mills R hip fracture.  S/p surgery per orthopedics.   Plan for discharge home with home health.  See below for additional details     Hospital Course:  Assessment and Plan: * Closed displaced fracture of right femoral neck (Kristin Mills) Plain films with acute right femoral neck fracture with impaction and proximal displacement Mechanical fall Likely >4 mets S/p right total hip arthroplasty, direct anterior approach 8/23 Post op PT/OT Pain meds, bowel regimen per ortho Needs outpatient follow up with PCP for management of osteoporosis  She'll need anticoagulation with eliquis given hx lupus anticoagulant, see below - 6-8 weeks    Lupus anticoagulant disorder Kristin Mills) Discussed with hematology who recommend 6-8 weeks full dose anticoagulation with eliquis post  op  Hyperlipidemia simvastatin  Hypertension amlodipine  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Kristin Mills) In remision  Hyperglycemia Mild, check a1c outpatient   Acute postoperative anemia due to expected blood loss trend          Procedures:  Right total hip arthroplasty, direct anterior approach    Consultations: orthopedics  Discharge Exam: Vitals:   09/13/21 0756 09/13/21 1457  BP: 99/62 (!) 114/57  Pulse: 93 89  Resp: 17 18  Temp: 97.6 F (36.4 C) 98.4 F (36.9 C)  SpO2: 97% 99%   No new complaints, some nausea with iron  General: No acute distress. Cardiovascular: RRR Lungs: unlabored Neurological: Alert and oriented 3. Moves all extremities 4 with equal strength. Cranial nerves II through XII grossly intact. Extremities: R thigh intact dressing  Discharge Instructions   Discharge Instructions     Call MD for:  difficulty breathing, headache or visual disturbances   Complete by: As directed    Call MD for:  extreme fatigue   Complete by: As directed    Call MD for:  hives   Complete by: As directed    Call MD for:  persistant dizziness or light-headedness   Complete by: As directed    Call MD for:  persistant nausea and vomiting   Complete by: As directed    Call MD for:  redness, tenderness, or signs of infection (pain, swelling, redness, odor or green/yellow discharge around incision site)   Complete by: As directed    Call MD for:  severe uncontrolled pain   Complete by: As directed    Call MD for:  temperature >100.4   Complete by: As directed    Diet - low  sodium heart healthy   Complete by: As directed    Discharge instructions   Complete by: As directed    You were seen for Kristin Mills hip fracture.    This was surgically repaired by orthopedics.  You'll continue to have therapy at home.  Please follow up with orthopedics as an outpatient.  Follow up with your PCP regarding osteoporosis and treatment for this.     Continue tylenol as needed for your pain.  Do not exceed more than 3 grams of tylenol in 1 day (of note, your percocet also has tylenol in it).    Your blood sugars were mildly elevated at times.  I recommend getting an A1c with your PCP outpatient.   Return for new, recurrent, or worsening symptoms.  Please ask your PCP to request records from this hospitalization so they know what was done and what the next steps will be.   Discharge wound care:   Complete by: As directed    Per orthopedics   Increase activity slowly   Complete by: As directed       Allergies as of 09/13/2021   No Known Allergies      Medication List     STOP taking these medications    aspirin EC 81 MG tablet       TAKE these medications    amLODipine 5 MG tablet Commonly known as: NORVASC Take 5 mg by mouth daily.   apixaban 5 MG Tabs tablet Commonly known as: ELIQUIS Take 1 tablet (5 mg total) by mouth 2 (two) times daily. PER DR YATES MUST TAKE AT LEAST 6-8 WEEKS POSTOP   cholecalciferol 25 MCG (1000 UNIT) tablet Commonly known as: VITAMIN D3 Take 1 tablet (1,000 Units total) by mouth daily.   Guaifenesin 1200 MG Tb12 Commonly known as: Mucinex Maximum Strength Take 1 tablet (1,200 mg total) by mouth every 12 (twelve) hours as needed.   methocarbamol 500 MG tablet Commonly known as: ROBAXIN Take 1 tablet (500 mg total) by mouth every 6 (six) hours as needed for muscle spasms.   oxyCODONE-acetaminophen 5-325 MG tablet Commonly known as: PERCOCET/ROXICET Take 1 tablet by mouth every 6 (six) hours as needed for severe pain.   simvastatin 10 MG tablet Commonly known as: ZOCOR Take 10 mg by mouth daily.               Durable Medical Equipment  (From admission, onward)           Start     Ordered   09/13/21 1629  DME 3-in-1  Once        09/13/21 1632   09/13/21 1629  DME Walker  Once       Question Answer Comment  Walker: With [place] Wheels   Patient needs [name] walker  to treat with the following condition Hip fracture ([place])      09/13/21 1632   09/12/21 1616  For home use only DME 3 n 1  Once        09/12/21 1615   09/12/21 1612  For home use only DME Walker rolling  Once       Question Answer Comment  Walker: With [place] Wheels   Patient needs Rawlins Stuard walker to treat with the following condition Gait abnormality      09/12/21 1612              Discharge Care Instructions  (From admission, onward)  Start     Ordered   09/13/21 0000  Discharge wound care:       Comments: Per orthopedics   09/13/21 1632           No Known Allergies  Follow-up Information     [name], MD Follow up in 1 week(s).   Specialty: Orthopedic Surgery Contact information: [place] [address] [phone]                  The results of significant diagnostics from this hospitalization (including imaging, microbiology, ancillary and laboratory) are listed below for reference.    Significant Diagnostic Studies: DG HIP UNILAT WITH PELVIS 2-3 VIEWS RIGHT  Result Date: 09/11/2021 CLINICAL DATA:  Fracture right hip EXAM: DG HIP (WITH OR WITHOUT PELVIS) 2-3V RIGHT COMPARISON:  Striated on on 09/10/2021 FINDINGS: Fluoroscopic images show internal fixation of fracture of neck of right femur. Fluoroscopy time 13 seconds. Radiation dose 1.39 mGy. IMPRESSION: Fluoroscopic assistance was provided for internal fixation of fracture of neck of right femur. Electronically Signed   By: [name] M.D.   On: 09/11/2021 17:21   DG Hip Port Unilat With Pelvis 1V Right  Result Date: 09/11/2021 CLINICAL DATA:  [id number]; postop check of the right hip EXAM: DG HIP (WITH OR WITHOUT PELVIS) 1V PORT RIGHT COMPARISON:  September 10, 2021 FINDINGS: There has been interval right hip prosthesis. The prosthetic components are in near anatomical alignment. Soft tissue emphysema. Skin staples. Left hip joint and the remainder of the bony pelvis  have Mikia Delaluz normal appearance. IMPRESSION: Status post right hip prosthesis and the prosthetic components are in near anatomical alignment. Soft tissue emphysema. Electronically Signed   By: [name] M.D.   On: 09/11/2021 17:04   DG Chest Port 1 View  Result Date: 09/10/2021 CLINICAL DATA:  pre-op right femoral neck fracture EXAM: PORTABLE CHEST 1 VIEW COMPARISON:  None Available. FINDINGS: The heart size and mediastinal contours are within normal limits. Both lungs are clear. No pleural effusion. The visualized skeletal structures are unremarkable. IMPRESSION: No active disease. Electronically Signed   By: Macy Mis M.D.   On: 09/10/2021 13:30   DG Knee 1-2 Views Right  Result Date: 09/10/2021 CLINICAL DATA:  Fall, pain, initial encounter. EXAM: RIGHT KNEE - 1-2 VIEW COMPARISON:  None Available. FINDINGS: No joint effusion or fracture.  Trace patellofemoral osteophytosis. IMPRESSION: 1. No acute findings. 2. Trace patellofemoral osteophytosis. Electronically Signed   By: Lorin Picket M.D.   On: 09/10/2021 13:26   DG Hip Unilat W or Wo Pelvis 2-3 Views Right  Result Date: 09/10/2021 CLINICAL DATA:  swelling EXAM: DG HIP (WITH OR WITHOUT PELVIS) 2-3V RIGHT COMPARISON:  None Available. FINDINGS: Acute right femoral neck fracture with impaction and proximal displacement. Femoral head appears located. Surrounding soft tissue swelling. IMPRESSION: Acute right femoral neck fracture with impaction and proximal displacement. Electronically Signed   By: Margaretha Sheffield M.D.   On: 09/10/2021 12:30    Microbiology: Recent Results (from the past 240 hour(s))  Surgical pcr screen     Status: None   Collection Time: 09/11/21  1:29 AM   Specimen: Nasal Mucosa; Nasal Swab  Result Value Ref Range Status   MRSA, PCR NEGATIVE NEGATIVE Final   Staphylococcus aureus NEGATIVE NEGATIVE Final    Comment: (NOTE) The Xpert SA Assay (FDA approved for NASAL specimens in patients 28 years of age and older), is  one component of Shawnese Magner comprehensive surveillance program. It is not  intended to diagnose infection nor to guide or monitor treatment. Performed at [facility], [place] [address] [place], [place] [address]      Labs: Basic Metabolic Panel: Recent Labs  Lab 09/10/21 1257 09/10/21 2003 09/11/21 1032 09/12/21 0203 09/13/21 0215  NA 138  --  138 137 139  K 3.8  --  4.2 3.8 3.5  CL 104  --  108 107 105  CO2 24  --  '24 22 24  '$ GLUCOSE 111*  --  107* 127* 109*  BUN 21  --  '12 9 9  '$ CREATININE 0.88  --  0.78 0.63 0.89  CALCIUM 9.3  --  9.0 8.2* 8.8*  MG  --  2.1  --  1.9 2.2  PHOS  --  3.8  --  2.7 3.0   Liver Function Tests: Recent Labs  Lab 09/11/21 1032  AST 19  ALT 15  ALKPHOS 54  BILITOT 1.8*  PROT 6.6  ALBUMIN 4.0   No results for input(s): "LIPASE", "AMYLASE" in the last 168 hours. No results for input(s): "AMMONIA" in the last 168 hours. CBC: Recent Labs  Lab 09/10/21 1257 09/11/21 1032 09/12/21 0203 09/13/21 0215  WBC 7.3 6.7 6.5 7.1  NEUTROABS 5.5 5.3  --  4.7  HGB 13.4 12.9 11.3* 11.2*  HCT 39.5 39.1 33.3* 33.3*  MCV 86.2 88.3 86.5 88.3  PLT 233 226 165 181   Cardiac Enzymes: Recent Labs  Lab 09/10/21 2003  CKTOTAL 116   BNP: BNP (last 3 results) No results for input(s): "BNP" in the last 8760 hours.  ProBNP (last 3 results) No results for input(s): "PROBNP" in the last 8760 hours.  CBG: No results for input(s): "GLUCAP" in the last 168 hours.     Signed:  Fayrene Helper MD.  [facility] 09/13/2021, 4:43 PM

## 2021-09-18 DIAGNOSIS — S72001D Fracture of unspecified part of neck of right femur, subsequent encounter for closed fracture with routine healing: Secondary | ICD-10-CM | POA: Diagnosis not present

## 2021-09-18 DIAGNOSIS — C50311 Malignant neoplasm of lower-inner quadrant of right female breast: Secondary | ICD-10-CM | POA: Diagnosis not present

## 2021-09-18 DIAGNOSIS — I10 Essential (primary) hypertension: Secondary | ICD-10-CM | POA: Diagnosis not present

## 2021-09-18 DIAGNOSIS — D630 Anemia in neoplastic disease: Secondary | ICD-10-CM | POA: Diagnosis not present

## 2021-09-18 DIAGNOSIS — Z96641 Presence of right artificial hip joint: Secondary | ICD-10-CM | POA: Diagnosis not present

## 2021-09-19 ENCOUNTER — Encounter: Payer: Self-pay | Admitting: Surgery

## 2021-09-19 ENCOUNTER — Ambulatory Visit (INDEPENDENT_AMBULATORY_CARE_PROVIDER_SITE_OTHER): Payer: Medicare Other | Admitting: Surgery

## 2021-09-19 ENCOUNTER — Telehealth: Payer: Self-pay | Admitting: Orthopaedic Surgery

## 2021-09-19 VITALS — BP 107/72 | HR 82 | Ht 68.0 in | Wt 173.9 lb

## 2021-09-19 DIAGNOSIS — Z96641 Presence of right artificial hip joint: Secondary | ICD-10-CM

## 2021-09-19 NOTE — Progress Notes (Signed)
Post-Op Visit Note   Patient: Kristin Mills           Date of Birth: 02/14/1955           MRN: [id number] Visit Date: 09/19/2021 PCP: [name], DO   Assessment & Plan:  Chief Complaint:  Chief Complaint  Patient presents with   Right Hip - Routine Post Op  6 Visit Diagnoses:  1. Status post total hip replacement, right     Plan: We will have patient follow-up with Dr. Lorin Mercy in 1 week for wound check and possible staple removal.  Possibly get right hip postop x-rays next visit.  Continue working with home health PT.  Follow-Up Instructions: Return in about 6 days (around 09/25/2021) for WITH DR YATES FOR WOUND CHECK AND POSSIBLE STAPLE REMOVAL.   Orders:  No orders of the defined types were placed in this encounter.  No orders of the defined types were placed in this encounter.   Imaging: No results found.  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 Allyson Sabal in office    Family History  Problem Relation Age of Onset   Skin cancer Mother    Lung cancer Father     Lung cancer Maternal Uncle    Lung cancer Paternal Uncle     Past Surgical History:  Procedure Laterality Date   ABDOMINAL HYSTERECTOMY  1999   partial; left ovaries   BREAST LUMPECTOMY Right 2013   BREAST LUMPECTOMY WITH NEEDLE LOCALIZATION AND AXILLARY SENTINEL LYMPH NODE BX  12/09/2011   Procedure: BREAST LUMPECTOMY WITH NEEDLE LOCALIZATION AND AXILLARY SENTINEL LYMPH NODE BX;  Surgeon: Shann Medal, MD;  Location: [facility];  Service: General;  Laterality: Right;   BREAST SURGERY Right 2013   DILATION AND CURETTAGE OF UTERUS     EYE SURGERY     laser over 10 years both eyes   GANGLION CYST EXCISION     right   PERIPHERAL IRIDOTOMY  2009   bilateral   TOTAL HIP ARTHROPLASTY Right 09/11/2021   Procedure: RIGHT TOTAL HIP ARTHROPLASTY ANTERIOR APPROACH;  Surgeon: [name], MD;  Location: [facility];  Service: Orthopedics;  Laterality: Right;   Social History   Occupational History   Not on file  Tobacco Use   Smoking status: Never   Smokeless tobacco: Never  Vaping Use   Vaping Use: Never used  Substance and Sexual Activity   Alcohol use: Yes    Alcohol/week: 3.0 standard drinks of alcohol    Types: 3 Glasses of wine  per week   Drug use: No   Sexual activity: Yes    Exam Pleasant female alert and oriented in no acute stress.  Wound looks good.  Staples intact.  No drainage or signs of infection.  New Mepilex applied.

## 2021-09-19 NOTE — Telephone Encounter (Signed)
Patient was told by Ricard Dillon to come back in 1 week to see Lorin Mercy no available appt staples were to be taken out so not sure if that would be just a nurse visit or if she needs to see the Dr, patient is available Thursday (willing to go to [facility]), Friday or that next Monday. Please advise

## 2021-09-20 DIAGNOSIS — D630 Anemia in neoplastic disease: Secondary | ICD-10-CM | POA: Diagnosis not present

## 2021-09-20 DIAGNOSIS — Z96641 Presence of right artificial hip joint: Secondary | ICD-10-CM | POA: Diagnosis not present

## 2021-09-20 DIAGNOSIS — I10 Essential (primary) hypertension: Secondary | ICD-10-CM | POA: Diagnosis not present

## 2021-09-20 DIAGNOSIS — S72001D Fracture of unspecified part of neck of right femur, subsequent encounter for closed fracture with routine healing: Secondary | ICD-10-CM | POA: Diagnosis not present

## 2021-09-20 DIAGNOSIS — C50311 Malignant neoplasm of lower-inner quadrant of right female breast: Secondary | ICD-10-CM | POA: Diagnosis not present

## 2021-09-23 DIAGNOSIS — S72001D Fracture of unspecified part of neck of right femur, subsequent encounter for closed fracture with routine healing: Secondary | ICD-10-CM | POA: Diagnosis not present

## 2021-09-23 DIAGNOSIS — I10 Essential (primary) hypertension: Secondary | ICD-10-CM | POA: Diagnosis not present

## 2021-09-23 DIAGNOSIS — Z96641 Presence of right artificial hip joint: Secondary | ICD-10-CM | POA: Diagnosis not present

## 2021-09-23 DIAGNOSIS — D630 Anemia in neoplastic disease: Secondary | ICD-10-CM | POA: Diagnosis not present

## 2021-09-23 DIAGNOSIS — C50311 Malignant neoplasm of lower-inner quadrant of right female breast: Secondary | ICD-10-CM | POA: Diagnosis not present

## 2021-09-24 DIAGNOSIS — Z09 Encounter for follow-up examination after completed treatment for conditions other than malignant neoplasm: Secondary | ICD-10-CM | POA: Diagnosis not present

## 2021-09-24 DIAGNOSIS — M810 Age-related osteoporosis without current pathological fracture: Secondary | ICD-10-CM | POA: Diagnosis not present

## 2021-09-24 DIAGNOSIS — S72009A Fracture of unspecified part of neck of unspecified femur, initial encounter for closed fracture: Secondary | ICD-10-CM | POA: Diagnosis not present

## 2021-09-24 DIAGNOSIS — R031 Nonspecific low blood-pressure reading: Secondary | ICD-10-CM | POA: Diagnosis not present

## 2021-09-25 DIAGNOSIS — I10 Essential (primary) hypertension: Secondary | ICD-10-CM | POA: Diagnosis not present

## 2021-09-25 DIAGNOSIS — D630 Anemia in neoplastic disease: Secondary | ICD-10-CM | POA: Diagnosis not present

## 2021-09-25 DIAGNOSIS — Z96641 Presence of right artificial hip joint: Secondary | ICD-10-CM | POA: Diagnosis not present

## 2021-09-25 DIAGNOSIS — S72001D Fracture of unspecified part of neck of right femur, subsequent encounter for closed fracture with routine healing: Secondary | ICD-10-CM | POA: Diagnosis not present

## 2021-09-25 DIAGNOSIS — C50311 Malignant neoplasm of lower-inner quadrant of right female breast: Secondary | ICD-10-CM | POA: Diagnosis not present

## 2021-09-27 ENCOUNTER — Encounter: Payer: Self-pay | Admitting: Orthopaedic Surgery

## 2021-09-27 ENCOUNTER — Ambulatory Visit (INDEPENDENT_AMBULATORY_CARE_PROVIDER_SITE_OTHER): Payer: Medicare Other | Admitting: Orthopaedic Surgery

## 2021-09-27 VITALS — BP 122/73 | HR 99 | Ht 68.0 in | Wt 173.0 lb

## 2021-09-27 DIAGNOSIS — S72001A Fracture of unspecified part of neck of right femur, initial encounter for closed fracture: Secondary | ICD-10-CM

## 2021-09-27 NOTE — Progress Notes (Signed)
Post-Op Visit Note   Patient: Kristin Mills           Date of Birth: [birth year]           MRN: [id number] Visit Date: 09/27/2021 PCP: [name], DO   Assessment & Plan: Follow-up total of arthroplasty Staples removed incision looks good.  Return final visit 1 month.  Chief Complaint:  Chief Complaint  Patient presents with   Right Hip - Routine Post Op, Follow-up    09/11/2021 Right THA   Visit Diagnoses:  1. Closed displaced fracture of right femoral neck ([facility])         Post total hip arthroplasty  Plan: Return in 1 month no x-ray needed on return.  Follow-Up Instructions: No follow-ups on file.   Orders:  No orders of the defined types were placed in this encounter.  No orders of the defined types were placed in this encounter.   Imaging: No results found.  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 Allyson Sabal in office    Family History  Problem Relation Age of Onset   Skin cancer Mother    Lung cancer Father    Lung cancer Maternal Uncle    Lung cancer  Paternal Uncle     Past Surgical History:  Procedure Laterality Date   ABDOMINAL HYSTERECTOMY  1999   partial; left ovaries   BREAST LUMPECTOMY Right 2013   BREAST LUMPECTOMY WITH NEEDLE LOCALIZATION AND AXILLARY SENTINEL LYMPH NODE BX  12/09/2011   Procedure: BREAST LUMPECTOMY WITH NEEDLE LOCALIZATION AND AXILLARY SENTINEL LYMPH NODE BX;  Surgeon: Shann Medal, MD;  Location: [facility];  Service: General;  Laterality: Right;   BREAST SURGERY Right 2013   DILATION AND CURETTAGE OF UTERUS     EYE SURGERY     laser over 10 years both eyes   GANGLION CYST EXCISION     right   PERIPHERAL IRIDOTOMY  2009   bilateral   TOTAL HIP ARTHROPLASTY Right 09/11/2021   Procedure: RIGHT TOTAL HIP ARTHROPLASTY ANTERIOR APPROACH;  Surgeon: [name], MD;  Location: [facility];  Service: Orthopedics;  Laterality: Right;   Social History   Occupational History   Not on file  Tobacco Use   Smoking status: Never   Smokeless tobacco: Never  Vaping Use   Vaping Use: Never used  Substance and Sexual Activity   Alcohol use: Yes    Alcohol/week: 3.0 standard drinks of alcohol    Types: 3 Glasses of wine per week   Drug use:  No   Sexual activity: Yes

## 2021-09-30 DIAGNOSIS — S72001D Fracture of unspecified part of neck of right femur, subsequent encounter for closed fracture with routine healing: Secondary | ICD-10-CM | POA: Diagnosis not present

## 2021-09-30 DIAGNOSIS — D630 Anemia in neoplastic disease: Secondary | ICD-10-CM | POA: Diagnosis not present

## 2021-09-30 DIAGNOSIS — Z96641 Presence of right artificial hip joint: Secondary | ICD-10-CM | POA: Diagnosis not present

## 2021-09-30 DIAGNOSIS — I10 Essential (primary) hypertension: Secondary | ICD-10-CM | POA: Diagnosis not present

## 2021-09-30 DIAGNOSIS — C50311 Malignant neoplasm of lower-inner quadrant of right female breast: Secondary | ICD-10-CM | POA: Diagnosis not present

## 2021-10-02 DIAGNOSIS — I10 Essential (primary) hypertension: Secondary | ICD-10-CM | POA: Diagnosis not present

## 2021-10-02 DIAGNOSIS — C50311 Malignant neoplasm of lower-inner quadrant of right female breast: Secondary | ICD-10-CM | POA: Diagnosis not present

## 2021-10-02 DIAGNOSIS — D630 Anemia in neoplastic disease: Secondary | ICD-10-CM | POA: Diagnosis not present

## 2021-10-02 DIAGNOSIS — Z96641 Presence of right artificial hip joint: Secondary | ICD-10-CM | POA: Diagnosis not present

## 2021-10-02 DIAGNOSIS — S72001D Fracture of unspecified part of neck of right femur, subsequent encounter for closed fracture with routine healing: Secondary | ICD-10-CM | POA: Diagnosis not present

## 2021-10-07 DIAGNOSIS — I10 Essential (primary) hypertension: Secondary | ICD-10-CM | POA: Diagnosis not present

## 2021-10-07 DIAGNOSIS — S72001D Fracture of unspecified part of neck of right femur, subsequent encounter for closed fracture with routine healing: Secondary | ICD-10-CM | POA: Diagnosis not present

## 2021-10-07 DIAGNOSIS — Z96641 Presence of right artificial hip joint: Secondary | ICD-10-CM | POA: Diagnosis not present

## 2021-10-07 DIAGNOSIS — D630 Anemia in neoplastic disease: Secondary | ICD-10-CM | POA: Diagnosis not present

## 2021-10-07 DIAGNOSIS — C50311 Malignant neoplasm of lower-inner quadrant of right female breast: Secondary | ICD-10-CM | POA: Diagnosis not present

## 2021-10-08 DIAGNOSIS — Z96641 Presence of right artificial hip joint: Secondary | ICD-10-CM | POA: Diagnosis not present

## 2021-10-08 DIAGNOSIS — D630 Anemia in neoplastic disease: Secondary | ICD-10-CM | POA: Diagnosis not present

## 2021-10-08 DIAGNOSIS — C50311 Malignant neoplasm of lower-inner quadrant of right female breast: Secondary | ICD-10-CM | POA: Diagnosis not present

## 2021-10-08 DIAGNOSIS — I10 Essential (primary) hypertension: Secondary | ICD-10-CM | POA: Diagnosis not present

## 2021-10-08 DIAGNOSIS — S72001D Fracture of unspecified part of neck of right femur, subsequent encounter for closed fracture with routine healing: Secondary | ICD-10-CM | POA: Diagnosis not present

## 2021-10-09 DIAGNOSIS — C50311 Malignant neoplasm of lower-inner quadrant of right female breast: Secondary | ICD-10-CM | POA: Diagnosis not present

## 2021-10-09 DIAGNOSIS — S72001D Fracture of unspecified part of neck of right femur, subsequent encounter for closed fracture with routine healing: Secondary | ICD-10-CM | POA: Diagnosis not present

## 2021-10-09 DIAGNOSIS — D630 Anemia in neoplastic disease: Secondary | ICD-10-CM | POA: Diagnosis not present

## 2021-10-09 DIAGNOSIS — I10 Essential (primary) hypertension: Secondary | ICD-10-CM | POA: Diagnosis not present

## 2021-10-09 DIAGNOSIS — Z96641 Presence of right artificial hip joint: Secondary | ICD-10-CM | POA: Diagnosis not present

## 2021-10-14 DIAGNOSIS — C50311 Malignant neoplasm of lower-inner quadrant of right female breast: Secondary | ICD-10-CM | POA: Diagnosis not present

## 2021-10-14 DIAGNOSIS — S72001D Fracture of unspecified part of neck of right femur, subsequent encounter for closed fracture with routine healing: Secondary | ICD-10-CM | POA: Diagnosis not present

## 2021-10-14 DIAGNOSIS — Z96641 Presence of right artificial hip joint: Secondary | ICD-10-CM | POA: Diagnosis not present

## 2021-10-14 DIAGNOSIS — D630 Anemia in neoplastic disease: Secondary | ICD-10-CM | POA: Diagnosis not present

## 2021-10-14 DIAGNOSIS — I10 Essential (primary) hypertension: Secondary | ICD-10-CM | POA: Diagnosis not present

## 2021-10-17 DIAGNOSIS — M8000XD Age-related osteoporosis with current pathological fracture, unspecified site, subsequent encounter for fracture with routine healing: Secondary | ICD-10-CM | POA: Diagnosis not present

## 2021-10-17 DIAGNOSIS — I10 Essential (primary) hypertension: Secondary | ICD-10-CM | POA: Diagnosis not present

## 2021-10-21 DIAGNOSIS — D630 Anemia in neoplastic disease: Secondary | ICD-10-CM | POA: Diagnosis not present

## 2021-10-21 DIAGNOSIS — C50311 Malignant neoplasm of lower-inner quadrant of right female breast: Secondary | ICD-10-CM | POA: Diagnosis not present

## 2021-10-21 DIAGNOSIS — I10 Essential (primary) hypertension: Secondary | ICD-10-CM | POA: Diagnosis not present

## 2021-10-21 DIAGNOSIS — S72001D Fracture of unspecified part of neck of right femur, subsequent encounter for closed fracture with routine healing: Secondary | ICD-10-CM | POA: Diagnosis not present

## 2021-10-21 DIAGNOSIS — Z96641 Presence of right artificial hip joint: Secondary | ICD-10-CM | POA: Diagnosis not present

## 2021-10-29 ENCOUNTER — Ambulatory Visit (INDEPENDENT_AMBULATORY_CARE_PROVIDER_SITE_OTHER): Payer: Medicare Other | Admitting: Orthopaedic Surgery

## 2021-10-29 ENCOUNTER — Encounter: Payer: Self-pay | Admitting: Orthopaedic Surgery

## 2021-10-29 DIAGNOSIS — Z96649 Presence of unspecified artificial hip joint: Secondary | ICD-10-CM | POA: Insufficient documentation

## 2021-10-29 DIAGNOSIS — Z96641 Presence of right artificial hip joint: Secondary | ICD-10-CM

## 2021-10-29 NOTE — Progress Notes (Signed)
Post-Op Visit Note   Patient: Kristin Mills           Date of Birth: 02/25/1955           MRN: [id number] Visit Date: 10/29/2021 PCP: [name], DO   Assessment & Plan: Follow-up total of arthroplasty.  Patient still has some start up pain first few steps and does well.  She uses a cane most of the time.  Denies any soreness around her thigh.  Incision can have a lotion applied twice daily.  She states she was borderline osteopenic and we discussed calcium, vitamin D.  Gradually increase to walking 2 miles a day 5 days a week.  The bone density goes down she can start Fosamax.  She states she is already talked with pharmacist about Prolia but think she does not want to take that.  With her lupus anticoagulant disorder she is on pharmacologic DVT prophylaxis but is now stopped.  No swelling in the legs she can walk without her cane.  First couple steps she limps and then walks well.  We can follow-up in a few months if she is having any problems.  We discussed gradually increasing her walking and increasing resistance work for her lower extremities and then she can plan on resuming working out at [employer] which is her goal.  Chief Complaint:  Chief Complaint  Patient presents with   Right Hip - Routine Post Op, Follow-up    09/11/2021 Right THA   Visit Diagnoses:  1. Status post total replacement of right hip     Plan: Return if she has ongoing symptoms.  Follow-Up Instructions: Return if symptoms worsen or fail to improve.   Orders:  No orders of the defined types were placed in this encounter.  No orders of the defined types were placed in this encounter.   Imaging: No results found.  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 Allyson Sabal in office    Family History  Problem Relation Age of Onset   Skin cancer Mother    Lung cancer Father    Lung cancer Maternal Uncle    Lung cancer Paternal Uncle     Past Surgical History:  Procedure Laterality Date   ABDOMINAL HYSTERECTOMY  1999   partial; left ovaries   BREAST LUMPECTOMY Right 2013   BREAST LUMPECTOMY WITH NEEDLE LOCALIZATION AND AXILLARY SENTINEL LYMPH NODE BX  12/09/2011   Procedure: BREAST LUMPECTOMY WITH NEEDLE LOCALIZATION AND AXILLARY SENTINEL LYMPH NODE  BX;  Surgeon: Shann Medal, MD;  Location: [facility];  Service: General;  Laterality: Right;   BREAST SURGERY Right 2013   DILATION AND CURETTAGE OF UTERUS     EYE SURGERY     laser over 10 years both eyes   GANGLION CYST EXCISION     right   PERIPHERAL IRIDOTOMY  2009   bilateral   TOTAL HIP ARTHROPLASTY Right 09/11/2021   Procedure: RIGHT TOTAL HIP ARTHROPLASTY ANTERIOR APPROACH;  Surgeon: [name], MD;  Location: [facility];  Service: Orthopedics;  Laterality: Right;   Social History   Occupational History   Not on file  Tobacco Use   Smoking status: Never   Smokeless tobacco: Never  Vaping Use   Vaping Use: Never used  Substance and Sexual Activity   Alcohol use: Yes    Alcohol/week:  3.0 standard drinks of alcohol    Types: 3 Glasses of wine per week   Drug use: No   Sexual activity: Yes

## 2021-10-30 ENCOUNTER — Telehealth: Payer: Self-pay | Admitting: Orthopaedic Surgery

## 2021-10-30 NOTE — Telephone Encounter (Signed)
Patient is status post total hip on 09/11/2021. OK for dental work with no antibiotics?

## 2021-10-30 NOTE — Telephone Encounter (Signed)
Patient called. Her dentist office needs a letter stating that medication is not needed for her to get her teeth clean. [facility] phone number is [phone]

## 2021-11-01 NOTE — Telephone Encounter (Signed)
Note faxed to [phone].

## 2021-11-04 DIAGNOSIS — Z96641 Presence of right artificial hip joint: Secondary | ICD-10-CM | POA: Diagnosis not present

## 2021-11-04 DIAGNOSIS — I10 Essential (primary) hypertension: Secondary | ICD-10-CM | POA: Diagnosis not present

## 2021-11-04 DIAGNOSIS — D630 Anemia in neoplastic disease: Secondary | ICD-10-CM | POA: Diagnosis not present

## 2021-11-04 DIAGNOSIS — S72001D Fracture of unspecified part of neck of right femur, subsequent encounter for closed fracture with routine healing: Secondary | ICD-10-CM | POA: Diagnosis not present

## 2021-11-04 DIAGNOSIS — C50311 Malignant neoplasm of lower-inner quadrant of right female breast: Secondary | ICD-10-CM | POA: Diagnosis not present

## 2021-11-05 ENCOUNTER — Telehealth: Payer: Self-pay

## 2021-11-05 NOTE — Telephone Encounter (Signed)
Patient called stating she wanted to let Dr. Lorin Mercy know that she actually has osteoporosis and she told him at the last visit that she has osteopenia. She wanted to know if that would change his opinion on the Prolia medication the had spoke about  (I did let her know he was out this week)

## 2021-11-06 NOTE — Telephone Encounter (Signed)
Please advise 

## 2021-11-08 DIAGNOSIS — Z23 Encounter for immunization: Secondary | ICD-10-CM | POA: Diagnosis not present

## 2021-11-12 ENCOUNTER — Other Ambulatory Visit: Payer: Self-pay | Admitting: Orthopaedic Surgery

## 2021-11-12 NOTE — Telephone Encounter (Signed)
noted 

## 2021-11-26 DIAGNOSIS — H524 Presbyopia: Secondary | ICD-10-CM | POA: Diagnosis not present

## 2021-11-26 DIAGNOSIS — H52222 Regular astigmatism, left eye: Secondary | ICD-10-CM | POA: Diagnosis not present

## 2021-11-26 DIAGNOSIS — H35033 Hypertensive retinopathy, bilateral: Secondary | ICD-10-CM | POA: Diagnosis not present

## 2021-11-26 DIAGNOSIS — H5203 Hypermetropia, bilateral: Secondary | ICD-10-CM | POA: Diagnosis not present

## 2021-12-17 DIAGNOSIS — R7303 Prediabetes: Secondary | ICD-10-CM | POA: Diagnosis not present

## 2021-12-17 DIAGNOSIS — E7800 Pure hypercholesterolemia, unspecified: Secondary | ICD-10-CM | POA: Diagnosis not present

## 2021-12-24 DIAGNOSIS — E7800 Pure hypercholesterolemia, unspecified: Secondary | ICD-10-CM | POA: Diagnosis not present

## 2021-12-24 DIAGNOSIS — R7303 Prediabetes: Secondary | ICD-10-CM | POA: Diagnosis not present

## 2021-12-24 DIAGNOSIS — Z853 Personal history of malignant neoplasm of breast: Secondary | ICD-10-CM | POA: Diagnosis not present

## 2021-12-24 DIAGNOSIS — M810 Age-related osteoporosis without current pathological fracture: Secondary | ICD-10-CM | POA: Diagnosis not present

## 2021-12-24 DIAGNOSIS — I10 Essential (primary) hypertension: Secondary | ICD-10-CM | POA: Diagnosis not present

## 2021-12-24 DIAGNOSIS — K219 Gastro-esophageal reflux disease without esophagitis: Secondary | ICD-10-CM | POA: Diagnosis not present

## 2022-01-21 ENCOUNTER — Other Ambulatory Visit: Payer: Self-pay | Admitting: Family Medicine

## 2022-01-21 DIAGNOSIS — Z1231 Encounter for screening mammogram for malignant neoplasm of breast: Secondary | ICD-10-CM

## 2022-01-24 DIAGNOSIS — C44712 Basal cell carcinoma of skin of right lower limb, including hip: Secondary | ICD-10-CM | POA: Diagnosis not present

## 2022-01-24 DIAGNOSIS — D485 Neoplasm of uncertain behavior of skin: Secondary | ICD-10-CM | POA: Diagnosis not present

## 2022-01-24 DIAGNOSIS — L810 Postinflammatory hyperpigmentation: Secondary | ICD-10-CM | POA: Diagnosis not present

## 2022-01-24 DIAGNOSIS — L814 Other melanin hyperpigmentation: Secondary | ICD-10-CM | POA: Diagnosis not present

## 2022-01-24 DIAGNOSIS — L718 Other rosacea: Secondary | ICD-10-CM | POA: Diagnosis not present

## 2022-01-24 DIAGNOSIS — L821 Other seborrheic keratosis: Secondary | ICD-10-CM | POA: Diagnosis not present

## 2022-01-24 DIAGNOSIS — L218 Other seborrheic dermatitis: Secondary | ICD-10-CM | POA: Diagnosis not present

## 2022-01-24 DIAGNOSIS — D225 Melanocytic nevi of trunk: Secondary | ICD-10-CM | POA: Diagnosis not present

## 2022-02-27 DIAGNOSIS — C44712 Basal cell carcinoma of skin of right lower limb, including hip: Secondary | ICD-10-CM | POA: Diagnosis not present

## 2022-03-11 ENCOUNTER — Ambulatory Visit
Admission: RE | Admit: 2022-03-11 | Discharge: 2022-03-11 | Disposition: A | Discharge status: Home / Self Care | Payer: Medicare Other | Source: Ambulatory Visit | Attending: Family Medicine | Admitting: Family Medicine

## 2022-03-11 DIAGNOSIS — Z1231 Encounter for screening mammogram for malignant neoplasm of breast: Secondary | ICD-10-CM

## 2022-03-24 DIAGNOSIS — M898X1 Other specified disorders of bone, shoulder: Secondary | ICD-10-CM | POA: Diagnosis not present

## 2022-05-19 DIAGNOSIS — N3001 Acute cystitis with hematuria: Secondary | ICD-10-CM | POA: Diagnosis not present

## 2022-05-22 DIAGNOSIS — L821 Other seborrheic keratosis: Secondary | ICD-10-CM | POA: Diagnosis not present

## 2022-05-22 DIAGNOSIS — L814 Other melanin hyperpigmentation: Secondary | ICD-10-CM | POA: Diagnosis not present

## 2022-05-22 DIAGNOSIS — Z08 Encounter for follow-up examination after completed treatment for malignant neoplasm: Secondary | ICD-10-CM | POA: Diagnosis not present

## 2022-05-22 DIAGNOSIS — B078 Other viral warts: Secondary | ICD-10-CM | POA: Diagnosis not present

## 2022-05-22 DIAGNOSIS — Z85828 Personal history of other malignant neoplasm of skin: Secondary | ICD-10-CM | POA: Diagnosis not present

## 2022-05-22 DIAGNOSIS — L905 Scar conditions and fibrosis of skin: Secondary | ICD-10-CM | POA: Diagnosis not present

## 2022-06-18 DIAGNOSIS — M898X1 Other specified disorders of bone, shoulder: Secondary | ICD-10-CM | POA: Diagnosis not present

## 2022-06-18 DIAGNOSIS — R229 Localized swelling, mass and lump, unspecified: Secondary | ICD-10-CM | POA: Diagnosis not present

## 2022-06-18 DIAGNOSIS — R2232 Localized swelling, mass and lump, left upper limb: Secondary | ICD-10-CM | POA: Diagnosis not present

## 2022-06-23 DIAGNOSIS — M810 Age-related osteoporosis without current pathological fracture: Secondary | ICD-10-CM | POA: Diagnosis not present

## 2022-06-23 DIAGNOSIS — E7800 Pure hypercholesterolemia, unspecified: Secondary | ICD-10-CM | POA: Diagnosis not present

## 2022-06-23 DIAGNOSIS — D72819 Decreased white blood cell count, unspecified: Secondary | ICD-10-CM | POA: Diagnosis not present

## 2022-06-23 DIAGNOSIS — R7303 Prediabetes: Secondary | ICD-10-CM | POA: Diagnosis not present

## 2022-06-23 DIAGNOSIS — R760 Raised antibody titer: Secondary | ICD-10-CM | POA: Diagnosis not present

## 2022-06-30 DIAGNOSIS — M810 Age-related osteoporosis without current pathological fracture: Secondary | ICD-10-CM | POA: Diagnosis not present

## 2022-06-30 DIAGNOSIS — K219 Gastro-esophageal reflux disease without esophagitis: Secondary | ICD-10-CM | POA: Diagnosis not present

## 2022-06-30 DIAGNOSIS — Z0000 Encounter for general adult medical examination without abnormal findings: Secondary | ICD-10-CM | POA: Diagnosis not present

## 2022-06-30 DIAGNOSIS — E7800 Pure hypercholesterolemia, unspecified: Secondary | ICD-10-CM | POA: Diagnosis not present

## 2022-06-30 DIAGNOSIS — R7303 Prediabetes: Secondary | ICD-10-CM | POA: Diagnosis not present

## 2022-06-30 DIAGNOSIS — Z853 Personal history of malignant neoplasm of breast: Secondary | ICD-10-CM | POA: Diagnosis not present

## 2022-06-30 DIAGNOSIS — I10 Essential (primary) hypertension: Secondary | ICD-10-CM | POA: Diagnosis not present

## 2022-07-14 DIAGNOSIS — M810 Age-related osteoporosis without current pathological fracture: Secondary | ICD-10-CM | POA: Diagnosis not present

## 2022-08-07 DIAGNOSIS — R1312 Dysphagia, oropharyngeal phase: Secondary | ICD-10-CM | POA: Diagnosis not present

## 2022-08-07 DIAGNOSIS — K219 Gastro-esophageal reflux disease without esophagitis: Secondary | ICD-10-CM | POA: Diagnosis not present

## 2022-08-07 DIAGNOSIS — E7800 Pure hypercholesterolemia, unspecified: Secondary | ICD-10-CM | POA: Diagnosis not present

## 2022-09-10 DIAGNOSIS — K219 Gastro-esophageal reflux disease without esophagitis: Secondary | ICD-10-CM | POA: Diagnosis not present

## 2022-09-10 DIAGNOSIS — R1310 Dysphagia, unspecified: Secondary | ICD-10-CM | POA: Diagnosis not present

## 2022-09-10 DIAGNOSIS — K222 Esophageal obstruction: Secondary | ICD-10-CM | POA: Diagnosis not present

## 2022-09-10 DIAGNOSIS — K31A19 Gastric intestinal metaplasia without dysplasia, unspecified site: Secondary | ICD-10-CM | POA: Diagnosis not present

## 2022-09-10 DIAGNOSIS — K2940 Chronic atrophic gastritis without bleeding: Secondary | ICD-10-CM | POA: Diagnosis not present

## 2022-09-10 DIAGNOSIS — K2930 Chronic superficial gastritis without bleeding: Secondary | ICD-10-CM | POA: Diagnosis not present

## 2022-09-17 DIAGNOSIS — K2940 Chronic atrophic gastritis without bleeding: Secondary | ICD-10-CM | POA: Diagnosis not present

## 2022-09-17 DIAGNOSIS — K219 Gastro-esophageal reflux disease without esophagitis: Secondary | ICD-10-CM | POA: Diagnosis not present

## 2022-09-17 DIAGNOSIS — K31A19 Gastric intestinal metaplasia without dysplasia, unspecified site: Secondary | ICD-10-CM | POA: Diagnosis not present

## 2022-10-20 DIAGNOSIS — K2970 Gastritis, unspecified, without bleeding: Secondary | ICD-10-CM | POA: Diagnosis not present

## 2022-10-20 DIAGNOSIS — K222 Esophageal obstruction: Secondary | ICD-10-CM | POA: Diagnosis not present

## 2022-11-17 DIAGNOSIS — Z23 Encounter for immunization: Secondary | ICD-10-CM | POA: Diagnosis not present

## 2022-11-21 DIAGNOSIS — H35033 Hypertensive retinopathy, bilateral: Secondary | ICD-10-CM | POA: Diagnosis not present

## 2022-11-21 DIAGNOSIS — H52223 Regular astigmatism, bilateral: Secondary | ICD-10-CM | POA: Diagnosis not present

## 2022-11-21 DIAGNOSIS — H2513 Age-related nuclear cataract, bilateral: Secondary | ICD-10-CM | POA: Diagnosis not present

## 2022-11-21 DIAGNOSIS — H524 Presbyopia: Secondary | ICD-10-CM | POA: Diagnosis not present

## 2022-11-21 DIAGNOSIS — H5203 Hypermetropia, bilateral: Secondary | ICD-10-CM | POA: Diagnosis not present

## 2022-12-26 DIAGNOSIS — L821 Other seborrheic keratosis: Secondary | ICD-10-CM | POA: Diagnosis not present

## 2022-12-26 DIAGNOSIS — L570 Actinic keratosis: Secondary | ICD-10-CM | POA: Diagnosis not present

## 2022-12-26 DIAGNOSIS — L2089 Other atopic dermatitis: Secondary | ICD-10-CM | POA: Diagnosis not present

## 2022-12-26 DIAGNOSIS — L720 Epidermal cyst: Secondary | ICD-10-CM | POA: Diagnosis not present

## 2022-12-29 DIAGNOSIS — R7303 Prediabetes: Secondary | ICD-10-CM | POA: Diagnosis not present

## 2022-12-29 DIAGNOSIS — E7800 Pure hypercholesterolemia, unspecified: Secondary | ICD-10-CM | POA: Diagnosis not present

## 2023-01-05 DIAGNOSIS — R7303 Prediabetes: Secondary | ICD-10-CM | POA: Diagnosis not present

## 2023-01-05 DIAGNOSIS — E7800 Pure hypercholesterolemia, unspecified: Secondary | ICD-10-CM | POA: Diagnosis not present

## 2023-01-05 DIAGNOSIS — M810 Age-related osteoporosis without current pathological fracture: Secondary | ICD-10-CM | POA: Diagnosis not present

## 2023-01-05 DIAGNOSIS — I10 Essential (primary) hypertension: Secondary | ICD-10-CM | POA: Diagnosis not present

## 2023-01-05 DIAGNOSIS — Z853 Personal history of malignant neoplasm of breast: Secondary | ICD-10-CM | POA: Diagnosis not present

## 2023-01-05 DIAGNOSIS — K219 Gastro-esophageal reflux disease without esophagitis: Secondary | ICD-10-CM | POA: Diagnosis not present

## 2023-01-05 DIAGNOSIS — R238 Other skin changes: Secondary | ICD-10-CM | POA: Diagnosis not present

## 2023-01-19 DIAGNOSIS — M810 Age-related osteoporosis without current pathological fracture: Secondary | ICD-10-CM | POA: Diagnosis not present

## 2023-01-23 ENCOUNTER — Other Ambulatory Visit: Payer: Self-pay | Admitting: Family Medicine

## 2023-01-23 DIAGNOSIS — Z0000 Encounter for general adult medical examination without abnormal findings: Secondary | ICD-10-CM

## 2023-01-27 ENCOUNTER — Telehealth: Payer: Self-pay | Admitting: Orthopaedic Surgery

## 2023-01-27 DIAGNOSIS — K219 Gastro-esophageal reflux disease without esophagitis: Secondary | ICD-10-CM | POA: Diagnosis not present

## 2023-01-27 DIAGNOSIS — R0982 Postnasal drip: Secondary | ICD-10-CM | POA: Diagnosis not present

## 2023-01-27 DIAGNOSIS — R09A2 Foreign body sensation, throat: Secondary | ICD-10-CM | POA: Diagnosis not present

## 2023-01-27 NOTE — Telephone Encounter (Signed)
 Victorino Dike from [employer] called requesting a letter from Dr. Ophelia Charter that pt does not need pre meds after hip replacement. Please fax letter to [phone]. [facility] phone number is [phone].

## 2023-01-27 NOTE — Telephone Encounter (Signed)
 Faxed as requested

## 2023-01-30 DIAGNOSIS — Z85828 Personal history of other malignant neoplasm of skin: Secondary | ICD-10-CM | POA: Diagnosis not present

## 2023-01-30 DIAGNOSIS — L718 Other rosacea: Secondary | ICD-10-CM | POA: Diagnosis not present

## 2023-01-30 DIAGNOSIS — L300 Nummular dermatitis: Secondary | ICD-10-CM | POA: Diagnosis not present

## 2023-01-30 DIAGNOSIS — L821 Other seborrheic keratosis: Secondary | ICD-10-CM | POA: Diagnosis not present

## 2023-01-30 DIAGNOSIS — B070 Plantar wart: Secondary | ICD-10-CM | POA: Diagnosis not present

## 2023-01-30 DIAGNOSIS — Z08 Encounter for follow-up examination after completed treatment for malignant neoplasm: Secondary | ICD-10-CM | POA: Diagnosis not present

## 2023-01-30 DIAGNOSIS — D225 Melanocytic nevi of trunk: Secondary | ICD-10-CM | POA: Diagnosis not present

## 2023-01-30 DIAGNOSIS — L853 Xerosis cutis: Secondary | ICD-10-CM | POA: Diagnosis not present

## 2023-01-30 DIAGNOSIS — R238 Other skin changes: Secondary | ICD-10-CM | POA: Diagnosis not present

## 2023-01-30 DIAGNOSIS — L814 Other melanin hyperpigmentation: Secondary | ICD-10-CM | POA: Diagnosis not present

## 2023-03-16 ENCOUNTER — Ambulatory Visit: Payer: Medicare Other

## 2023-03-20 ENCOUNTER — Ambulatory Visit
Admission: RE | Admit: 2023-03-20 | Discharge: 2023-03-20 | Disposition: A | Discharge status: Home / Self Care | Payer: Medicare Other | Source: Ambulatory Visit | Attending: Family Medicine | Admitting: Family Medicine

## 2023-03-20 DIAGNOSIS — Z1231 Encounter for screening mammogram for malignant neoplasm of breast: Secondary | ICD-10-CM | POA: Diagnosis not present

## 2023-03-20 DIAGNOSIS — Z0000 Encounter for general adult medical examination without abnormal findings: Secondary | ICD-10-CM

## 2023-05-01 DIAGNOSIS — D485 Neoplasm of uncertain behavior of skin: Secondary | ICD-10-CM | POA: Diagnosis not present

## 2023-06-25 DIAGNOSIS — E7800 Pure hypercholesterolemia, unspecified: Secondary | ICD-10-CM | POA: Diagnosis not present

## 2023-06-25 DIAGNOSIS — R7303 Prediabetes: Secondary | ICD-10-CM | POA: Diagnosis not present

## 2023-06-25 DIAGNOSIS — M810 Age-related osteoporosis without current pathological fracture: Secondary | ICD-10-CM | POA: Diagnosis not present

## 2023-07-16 DIAGNOSIS — M65351 Trigger finger, right little finger: Secondary | ICD-10-CM | POA: Diagnosis not present

## 2023-07-16 DIAGNOSIS — Z0000 Encounter for general adult medical examination without abnormal findings: Secondary | ICD-10-CM | POA: Diagnosis not present

## 2023-07-16 DIAGNOSIS — E7800 Pure hypercholesterolemia, unspecified: Secondary | ICD-10-CM | POA: Diagnosis not present

## 2023-07-16 DIAGNOSIS — M8000XD Age-related osteoporosis with current pathological fracture, unspecified site, subsequent encounter for fracture with routine healing: Secondary | ICD-10-CM | POA: Diagnosis not present

## 2023-07-16 DIAGNOSIS — R238 Other skin changes: Secondary | ICD-10-CM | POA: Diagnosis not present

## 2023-07-16 DIAGNOSIS — K219 Gastro-esophageal reflux disease without esophagitis: Secondary | ICD-10-CM | POA: Diagnosis not present

## 2023-07-16 DIAGNOSIS — I10 Essential (primary) hypertension: Secondary | ICD-10-CM | POA: Diagnosis not present

## 2023-07-16 DIAGNOSIS — M722 Plantar fascial fibromatosis: Secondary | ICD-10-CM | POA: Diagnosis not present

## 2023-07-16 DIAGNOSIS — R7303 Prediabetes: Secondary | ICD-10-CM | POA: Diagnosis not present

## 2023-07-16 DIAGNOSIS — Z853 Personal history of malignant neoplasm of breast: Secondary | ICD-10-CM | POA: Diagnosis not present

## 2023-07-16 DIAGNOSIS — E559 Vitamin D deficiency, unspecified: Secondary | ICD-10-CM | POA: Diagnosis not present

## 2023-07-21 DIAGNOSIS — M810 Age-related osteoporosis without current pathological fracture: Secondary | ICD-10-CM | POA: Diagnosis not present

## 2023-07-22 DIAGNOSIS — L408 Other psoriasis: Secondary | ICD-10-CM | POA: Diagnosis not present

## 2023-07-22 DIAGNOSIS — L718 Other rosacea: Secondary | ICD-10-CM | POA: Diagnosis not present

## 2023-07-22 DIAGNOSIS — L218 Other seborrheic dermatitis: Secondary | ICD-10-CM | POA: Diagnosis not present

## 2023-08-07 DIAGNOSIS — M720 Palmar fascial fibromatosis [Dupuytren]: Secondary | ICD-10-CM | POA: Diagnosis not present

## 2023-09-10 DIAGNOSIS — D485 Neoplasm of uncertain behavior of skin: Secondary | ICD-10-CM | POA: Diagnosis not present

## 2023-09-10 DIAGNOSIS — L408 Other psoriasis: Secondary | ICD-10-CM | POA: Diagnosis not present

## 2023-09-10 DIAGNOSIS — L218 Other seborrheic dermatitis: Secondary | ICD-10-CM | POA: Diagnosis not present

## 2023-09-10 DIAGNOSIS — L718 Other rosacea: Secondary | ICD-10-CM | POA: Diagnosis not present

## 2023-09-10 DIAGNOSIS — L538 Other specified erythematous conditions: Secondary | ICD-10-CM | POA: Diagnosis not present

## 2023-09-10 DIAGNOSIS — L570 Actinic keratosis: Secondary | ICD-10-CM | POA: Diagnosis not present

## 2023-09-10 DIAGNOSIS — L820 Inflamed seborrheic keratosis: Secondary | ICD-10-CM | POA: Diagnosis not present

## 2023-11-02 DIAGNOSIS — K2970 Gastritis, unspecified, without bleeding: Secondary | ICD-10-CM | POA: Diagnosis not present

## 2023-11-02 DIAGNOSIS — K222 Esophageal obstruction: Secondary | ICD-10-CM | POA: Diagnosis not present

## 2023-11-06 DIAGNOSIS — Z23 Encounter for immunization: Secondary | ICD-10-CM | POA: Diagnosis not present

## 2023-11-23 ENCOUNTER — Encounter: Payer: Self-pay | Admitting: Radiology

## 2024-01-28 ENCOUNTER — Ambulatory Visit (INDEPENDENT_AMBULATORY_CARE_PROVIDER_SITE_OTHER)

## 2024-01-28 ENCOUNTER — Ambulatory Visit: Admitting: Podiatry

## 2024-01-28 ENCOUNTER — Encounter: Payer: Self-pay | Admitting: Podiatry

## 2024-01-28 DIAGNOSIS — M722 Plantar fascial fibromatosis: Secondary | ICD-10-CM

## 2024-01-28 DIAGNOSIS — R399 Unspecified symptoms and signs involving the genitourinary system: Secondary | ICD-10-CM | POA: Insufficient documentation

## 2024-01-28 DIAGNOSIS — R319 Hematuria, unspecified: Secondary | ICD-10-CM | POA: Insufficient documentation

## 2024-01-28 DIAGNOSIS — K2970 Gastritis, unspecified, without bleeding: Secondary | ICD-10-CM | POA: Insufficient documentation

## 2024-01-28 MED ORDER — TRIAMCINOLONE ACETONIDE 40 MG/ML IJ SUSP
20.0000 mg | Freq: Once | INTRAMUSCULAR | Status: AC
  Administered 2024-01-28: 20 mg

## 2024-01-28 MED ORDER — METHYLPREDNISOLONE 4 MG PO TBPK: ORAL_TABLET | ORAL | 0 refills | Status: AC

## 2024-01-28 MED ORDER — MELOXICAM 15 MG PO TABS: 15.0000 mg | ORAL_TABLET | Freq: Every day | ORAL | 3 refills | Status: AC

## 2024-01-28 NOTE — Progress Notes (Signed)
 "  Subjective:  Patient ID: Kristin Mills, female    DOB: 10/13/1955,  MRN: [id number] HPI Chief Complaint  Patient presents with   Foot Pain    Plantar/posterior heel left - aching x 6 months, AM pain, active walker, tried supportive shoes and OTC insoles, takes Ibuprofen and ices PRN, wears a night brace she bought on [employer] Patient (Initial Visit)    69 y.o. female presents with the above complaint.   ROS: Denies fever chills nausea vomiting muscle aches pains calf pain back pain chest pain shortness of breath.  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 Ivin in office   Past Surgical History:  Procedure Laterality Date   ABDOMINAL HYSTERECTOMY  1999   partial; left ovaries   BREAST LUMPECTOMY Right 2013   BREAST LUMPECTOMY WITH NEEDLE LOCALIZATION AND AXILLARY SENTINEL LYMPH NODE BX  12/09/2011   Procedure: BREAST LUMPECTOMY WITH NEEDLE LOCALIZATION AND AXILLARY SENTINEL LYMPH NODE BX;  Surgeon: Alm VEAR Angle, MD;  Location: [facility] OR;  Service: General;  Laterality: Right;   BREAST SURGERY Right 2013   DILATION AND CURETTAGE OF UTERUS     EYE SURGERY     laser over 10 years both eyes   GANGLION CYST EXCISION     right   PERIPHERAL IRIDOTOMY  2009   bilateral   TOTAL HIP ARTHROPLASTY Right 09/11/2021   Procedure: RIGHT TOTAL HIP ARTHROPLASTY ANTERIOR APPROACH;  Surgeon: Barbarann Oneil BROCKS, MD;  Location: [facility] OR;  Service: Orthopedics;  Laterality: Right;   Current Medications[1]  Allergies[2] Review of Systems Objective:  There were no  vitals filed for this visit.  General: Well developed, nourished, in no acute distress, alert and oriented x3   Dermatological: Skin is warm, dry and supple bilateral. Nails x 10 are well maintained; remaining integument appears unremarkable at this time. There are no open sores, no preulcerative lesions, no rash or signs of infection present.  Vascular: Dorsalis Pedis artery and Posterior Tibial artery pedal pulses are 2/4 bilateral with immedate capillary fill time. Pedal hair growth present. No varicosities and no lower extremity edema present bilateral.   Neruologic: Grossly intact via light touch bilateral. Vibratory intact via tuning fork bilateral. Protective threshold with Semmes Wienstein monofilament intact to all pedal sites bilateral. Patellar and Achilles deep tendon reflexes 2+ bilateral. No Babinski or clonus noted bilateral.   Musculoskeletal: No gross boney pedal deformities bilateral. No pain, crepitus, or limitation noted with foot and ankle range of motion bilateral. Muscular strength 5/5 in all groups tested bilateral.  Pain on palpation MucoClear tubercle of her left heel.  Gait: Unassisted, Nonantalgic.    Radiographs:  Radiographs taken today demonstrate osseously mature individual soft tissue increase in density plantar fascial canny insertion site indicative of plantar fasciitis small plantar distal Oracaine heel spur is noted.   Assessment & Plan:   Assessment: Plantar fasciitis left.  Plan: Start her on methylprednisolone  to be followed by meloxicam .  Injected her left heel today  20 mg Kenalog  5 mg Marcaine  for maximal tenderness.  Toller procedure well without complications.  Discussed appropriate shoe gear stretching exercise ice therapy shoe gear modifications possible need for orthotics.     Kristin Mills, DPM    [1]  Current Outpatient Medications:    fluorouracil (EFUDEX) 5 % cream, Apply topically 2 (two) times daily as needed., Disp: , Rfl:     meloxicam  (MOBIC ) 15 MG tablet, Take 1 tablet (15 mg total) by mouth daily., Disp: 30 tablet, Rfl: 3   methylPREDNISolone  (MEDROL  DOSEPAK) 4 MG TBPK tablet, 6 day dose pack - take as directed, Disp: 21 tablet, Rfl: 0   amLODipine  (NORVASC ) 5 MG tablet, Take 5 mg by mouth daily., Disp: , Rfl:    famotidine (PEPCID) 40 MG tablet, Take 40 mg by mouth daily., Disp: , Rfl:    methocarbamol  (ROBAXIN ) 500 MG tablet, Take 1 tablet (500 mg total) by mouth every 6 (six) hours as needed for muscle spasms., Disp: 60 tablet, Rfl: 0   simvastatin  (ZOCOR ) 40 MG tablet, SMARTSIG:1 Tablet(s) By Mouth Every Evening, Disp: , Rfl:  [2] No Known Allergies  "

## 2024-02-02 ENCOUNTER — Other Ambulatory Visit: Payer: Self-pay

## 2024-02-02 DIAGNOSIS — Z1231 Encounter for screening mammogram for malignant neoplasm of breast: Secondary | ICD-10-CM

## 2024-03-01 ENCOUNTER — Ambulatory Visit: Admitting: Podiatry

## 2024-03-21 ENCOUNTER — Ambulatory Visit
